# Patient Record
Sex: Female | Born: 1955 | Race: Black or African American | Hispanic: No | Smoking: Never smoker
Health system: Southern US, Community
[De-identification: ages and names within clinical notes are randomized; demographics above are authoritative.]

## PROBLEM LIST (undated history)

## (undated) DIAGNOSIS — Z9289 Personal history of other medical treatment: Secondary | ICD-10-CM

## (undated) DIAGNOSIS — Z9889 Other specified postprocedural states: Secondary | ICD-10-CM

## (undated) DIAGNOSIS — I10 Essential (primary) hypertension: Secondary | ICD-10-CM

## (undated) DIAGNOSIS — E785 Hyperlipidemia, unspecified: Secondary | ICD-10-CM

## (undated) DIAGNOSIS — E559 Vitamin D deficiency, unspecified: Secondary | ICD-10-CM

## (undated) DIAGNOSIS — F32A Depression, unspecified: Secondary | ICD-10-CM

## (undated) DIAGNOSIS — E7800 Pure hypercholesterolemia, unspecified: Secondary | ICD-10-CM

## (undated) DIAGNOSIS — J309 Allergic rhinitis, unspecified: Secondary | ICD-10-CM

## (undated) DIAGNOSIS — E669 Obesity, unspecified: Secondary | ICD-10-CM

## (undated) DIAGNOSIS — F329 Major depressive disorder, single episode, unspecified: Secondary | ICD-10-CM

## (undated) DIAGNOSIS — R9431 Abnormal electrocardiogram [ECG] [EKG]: Secondary | ICD-10-CM

## (undated) HISTORY — DX: Abnormal electrocardiogram (ECG) (EKG): R94.31

## (undated) HISTORY — DX: Obesity, unspecified: E66.9

## (undated) HISTORY — DX: Pure hypercholesterolemia, unspecified: E78.00

## (undated) HISTORY — DX: Personal history of other medical treatment: Z92.89

## (undated) HISTORY — DX: Essential (primary) hypertension: I10

## (undated) HISTORY — DX: Vitamin D deficiency, unspecified: E55.9

## (undated) HISTORY — DX: Other specified postprocedural states: Z98.890

## (undated) HISTORY — DX: Major depressive disorder, single episode, unspecified: F32.9

## (undated) HISTORY — DX: Hyperlipidemia, unspecified: E78.5

## (undated) HISTORY — PX: CHOLECYSTECTOMY: SHX55

## (undated) HISTORY — DX: Allergic rhinitis, unspecified: J30.9

## (undated) HISTORY — DX: Depression, unspecified: F32.A

## (undated) HISTORY — PX: ABDOMINAL HYSTERECTOMY: SHX81

---

## 2003-05-20 ENCOUNTER — Ambulatory Visit (HOSPITAL_COMMUNITY): Admission: RE | Admit: 2003-05-20 | Discharge: 2003-05-20 | Payer: Self-pay | Admitting: Obstetrics & Gynecology

## 2006-04-18 ENCOUNTER — Inpatient Hospital Stay (HOSPITAL_COMMUNITY): Admission: AD | Admit: 2006-04-18 | Discharge: 2006-04-19 | Payer: Self-pay | Admitting: Obstetrics

## 2006-08-08 ENCOUNTER — Encounter (INDEPENDENT_AMBULATORY_CARE_PROVIDER_SITE_OTHER): Payer: Self-pay | Admitting: Specialist

## 2006-08-08 ENCOUNTER — Inpatient Hospital Stay (HOSPITAL_COMMUNITY): Admission: RE | Admit: 2006-08-08 | Discharge: 2006-08-11 | Payer: Self-pay | Admitting: Obstetrics & Gynecology

## 2008-06-28 ENCOUNTER — Encounter: Admission: RE | Admit: 2008-06-28 | Discharge: 2008-06-28 | Payer: Self-pay | Admitting: Internal Medicine

## 2009-08-02 ENCOUNTER — Encounter: Admission: RE | Admit: 2009-08-02 | Discharge: 2009-08-02 | Payer: Self-pay | Admitting: Internal Medicine

## 2009-11-30 ENCOUNTER — Encounter: Admission: RE | Admit: 2009-11-30 | Discharge: 2009-11-30 | Payer: Self-pay | Admitting: Internal Medicine

## 2010-09-21 ENCOUNTER — Other Ambulatory Visit: Payer: Self-pay | Admitting: Internal Medicine

## 2010-09-21 DIAGNOSIS — Z1231 Encounter for screening mammogram for malignant neoplasm of breast: Secondary | ICD-10-CM

## 2010-10-27 ENCOUNTER — Ambulatory Visit
Admission: RE | Admit: 2010-10-27 | Discharge: 2010-10-27 | Disposition: A | Discharge status: Home / Self Care | Payer: 59 | Source: Ambulatory Visit | Attending: Internal Medicine | Admitting: Internal Medicine

## 2010-10-27 DIAGNOSIS — Z1231 Encounter for screening mammogram for malignant neoplasm of breast: Secondary | ICD-10-CM

## 2010-11-17 NOTE — Discharge Summary (Signed)
NAME:  Kathy Juarez, Kathy Juarez NO.:  [id number]   MEDICAL RECORD NO.:  [id number]          PATIENT TYPE:  INP   LOCATION:  9308                          FACILITY:  [facility]   PHYSICIAN:  Roseanna Rainbow, M.D.DATE OF BIRTH:  December 29, 1955   DATE OF ADMISSION:  08/08/2006  DATE OF DISCHARGE:  08/11/2006                               DISCHARGE SUMMARY   CHIEF COMPLAINT:  The patient is a 55 year old African-American female  with a diagnosis of uterine fibroids with secondary menometrorrhagia and  iron deficiency anemia for an abdominal hysterectomy.  Please see the  dictated history and physical for further details.   HOSPITAL COURSE:  The patient was admitted and underwent a total  abdominal hysterectomy.  Again, please see the dictated operative  summary.  On postoperative day #1 her hemoglobin was 6.8.  She had been  transfused intraoperatively 2 units of packed red blood cells.  On  postoperative day #2 her hemoglobin was stable at 6.5 and she was  asymptomatic.  She was discharged to home on postoperative day #3  tolerating a regular diet.   DISCHARGE DIAGNOSES:  1. Uterine fibroids.  2. Iron deficiency anemia.   CONDITION ON DISCHARGE:  Condition stable.   DIET:  Regular.   ACTIVITY:  Pelvic rest, progressive activity.   DISCHARGE MEDICATIONS:  Medications included Percocet, Repliva.   DISPOSITION:  The patient was to follow up in the office in several  days.      Roseanna Rainbow, M.D.  Electronically Signed     LAJ/MEDQ  D:  08/29/2006  T:  08/29/2006  Job:  [id number]

## 2010-11-17 NOTE — Op Note (Signed)
NAME:  Kathy Juarez, Kathy Juarez NO.:  [id number]   MEDICAL RECORD NO.:  [id number]          PATIENT TYPE:  AMB   LOCATION:  SDC                           [facility]  [facility]   PHYSICIAN:  Roseanna Rainbow, M.D.DATE OF BIRTH:  [birth year]   DATE OF PROCEDURE:  08/08/2006  DATE OF DISCHARGE:                               OPERATIVE REPORT   PREOPERATIVE DIAGNOSIS:  Symptomatic uterine fibroids.   POSTOPERATIVE DIAGNOSIS:  Symptomatic uterine fibroids.   PROCEDURE:  Total abdominal hysterectomy.   SURGEON:  Jackson-Moore.   ANESTHESIA:  General endotracheal.   PATHOLOGY:  Uterus and cervix.   ESTIMATED BLOOD LOSS:  200 mL.   COMPLICATIONS:  None.   PROCEDURE:  The patient was taken to the operating room with an IV  running.  She was given general anesthesia, placed in the dorsal  lithotomy position and prepped and draped in the usual sterile fashion.  A midline vertical incision was then made through the previous scar and  carried down to the underlying fascia.  The fascia was incised along the  length of the incision.  The parietal peritoneum was tented up and  entered sharply.  The peritoneal incision was then extended superiorly  and inferiorly.  The bowel was then packed away with moistened  laparotomy packs.  Two long Kelly clamps were placed in the cornu for  retraction and the uterus exteriorized.  The round ligaments were then  divided with the Bovie bilaterally.  The utero-ovarian ligaments and  fallopian tubes were then doubly clamped with parametrial clamps,  transected and both free ligatures and suture ligatures were placed.  Adequate hemostasis was noted.  Again, this was performed bilaterally.  The bladder flap was then developed.  The bladder was then dissected  using the Bovie from the lower uterine segment and cervix.  The uterine  arteries were then clamped bilaterally, transected and suture ligated.  At this point, the uterine fundus was amputated  above the level of the  uterine pedicles.  The Balfour retractor was then placed into the  incisions.  The cardinal ligaments were then clamped, transected and  suture ligated bilaterally.  The uterosacral ligaments were then  clamped, transected and suture ligated bilaterally as well.  The cervix  was then amputated with scissors.  The remainder of the vaginal cuff was  then secured with interrupted sutures of 0 Vicryl.  The pelvis was then  copiously irrigated.  The pedicles were visualized and felt to be  hemostatic.  The parietal peritoneum and fascia were reapproximated in a  running fashion  as a single layer closure using a 0 PDS.  The skin was reapproximated in  a subcuticular fashion using 3-0 Monocryl.  Dermabond was then applied  to the skin.  At the close of the procedure, the instrument and pack  counts were said to be correct x2.  The patient was taken to the PACU  awake and in stable condition.      Roseanna Rainbow, M.D.  Electronically Signed     LAJ/MEDQ  D:  08/08/2006  T:  08/08/2006  Job:  [id number]

## 2010-11-17 NOTE — H&P (Signed)
NAME:  Kathy Juarez, Kathy Juarez NO.:  [id number]   MEDICAL RECORD NO.:  [id number]           PATIENT TYPE:   LOCATION:                                [facility]:  [facility]   PHYSICIAN:  Roseanna Rainbow, M.D. DATE OF BIRTH:   DATE OF ADMISSION:  DATE OF DISCHARGE:                              HISTORY & PHYSICAL   CHIEF COMPLAINT:  The patient is a 55 year old African-American female  with a diagnosis of uterine fibroids with secondary menometrorrhagia  iron and deficiency anemia refractory to attempts to control medically.   HISTORY OF PRESENT ILLNESS:  In November of 2004 she underwent an  ultrasound that demonstrated a uterus that was 11.9 cm in saggital  length and there were multiple fibroids noted measuring between 3 and 7  cm in diameter.  For approximately the past year the patient describes  heavier bleeding with menses, passing blood clots, the menses lasting  six days with associated cramping.  Her senses have been refractory to  attempts to manage these complaints with a mini pill and subsequent Depo-  Provera.  A recent hemoglobin on January 25 was 8.3 and a Pap smear from  October of 2006 was negative.   PAST GYN HISTORY:  Please see the above.  1. She has a history of cervical polyps.  2. She also has a history of a postpartum bilateral tubal ligation and      D&C.   PAST MEDICAL HISTORY:  No significant history of medical diseases.   PAST SURGICAL HISTORY:  She has a history of a cholecystectomy.  Please  see the above.   PAST OBSTETRICAL HISTORY:  The patient has been pregnant four times.  She has three living children.  There is a history of one miscarriage or  abortion.  She has had three cesarean deliveries.   SOCIAL HISTORY:  She is divorced.  She is an income maintenance case  Financial controller.  She has no significant smoking history.  She does not give any  significant history of alcohol usage.  She reports a minimal amount of  caffeinated beverages  daily.   FAMILY HISTORY:  CVA, hypertension, myocardial infarction.   MEDICATIONS:  Please see the medication reconciliation form.   ALLERGIES:  No known drug allergy.   PHYSICAL EXAMINATION:  VITAL SIGNS:  Temperature 97.9, pulse 101, blood  pressure 140/80, height 5 feet 3 inches, weight 221 pounds.  GENERAL:  Well-developed, well-nourished, no apparent distress.  NECK:  Supple.  LUNGS:  Clear to auscultation bilaterally.  HEART:  Regular rate and rhythm.  ABDOMEN:  Nontender.  No organomegaly.  PELVIC:  Normal EGBUS.  On speculum exam the vagina is clean.  On  bimanual exam the uterus is approximately 12-14 weeks' size, irregular  contour.  Nontender.  The adnexa are nonpalpable, nontender.   ASSESSMENT:  Symptomatic uterine fibroids with secondary menorrhagia and  iron deficiency anemia refractory to attempts to manage medically.   PLAN:  The planned procedure is a total abdominal hysterectomy.  The  risks and benefits of surgery were reviewed with the patient at length  and informed consent had  been obtained.      Roseanna Rainbow, M.D.  Electronically Signed     LAJ/MEDQ  D:  08/07/2006  T:  08/07/2006  Job:  [id number]

## 2011-10-02 ENCOUNTER — Other Ambulatory Visit: Payer: Self-pay | Admitting: Internal Medicine

## 2011-10-02 DIAGNOSIS — Z1231 Encounter for screening mammogram for malignant neoplasm of breast: Secondary | ICD-10-CM

## 2011-11-28 ENCOUNTER — Ambulatory Visit
Admission: RE | Admit: 2011-11-28 | Discharge: 2011-11-28 | Disposition: A | Discharge status: Home / Self Care | Payer: 59 | Source: Ambulatory Visit | Attending: Internal Medicine | Admitting: Internal Medicine

## 2011-11-28 DIAGNOSIS — Z1231 Encounter for screening mammogram for malignant neoplasm of breast: Secondary | ICD-10-CM

## 2012-12-11 ENCOUNTER — Other Ambulatory Visit: Payer: Self-pay

## 2012-12-11 DIAGNOSIS — Z1231 Encounter for screening mammogram for malignant neoplasm of breast: Secondary | ICD-10-CM

## 2013-01-12 ENCOUNTER — Ambulatory Visit
Admission: RE | Admit: 2013-01-12 | Discharge: 2013-01-12 | Disposition: A | Discharge status: Home / Self Care | Payer: 59 | Source: Ambulatory Visit

## 2013-01-12 DIAGNOSIS — Z1231 Encounter for screening mammogram for malignant neoplasm of breast: Secondary | ICD-10-CM

## 2014-02-09 ENCOUNTER — Other Ambulatory Visit: Payer: Self-pay

## 2014-02-09 DIAGNOSIS — Z1231 Encounter for screening mammogram for malignant neoplasm of breast: Secondary | ICD-10-CM

## 2014-02-12 ENCOUNTER — Ambulatory Visit
Admission: RE | Admit: 2014-02-12 | Discharge: 2014-02-12 | Disposition: A | Discharge status: Home / Self Care | Payer: 59 | Source: Ambulatory Visit

## 2014-02-12 DIAGNOSIS — Z1231 Encounter for screening mammogram for malignant neoplasm of breast: Secondary | ICD-10-CM

## 2014-02-24 ENCOUNTER — Ambulatory Visit: Payer: Self-pay

## 2014-02-24 ENCOUNTER — Other Ambulatory Visit: Payer: Self-pay | Admitting: Occupational Medicine

## 2014-02-24 DIAGNOSIS — R7611 Nonspecific reaction to tuberculin skin test without active tuberculosis: Secondary | ICD-10-CM

## 2014-07-03 ENCOUNTER — Emergency Department
Admission: EM | Admit: 2014-07-03 | Discharge: 2014-07-03 | Disposition: A | Discharge status: Home / Self Care | Payer: 59 | Source: Home / Self Care | Attending: Family Medicine | Admitting: Family Medicine

## 2014-07-03 ENCOUNTER — Encounter: Payer: Self-pay | Admitting: Emergency Medicine

## 2014-07-03 DIAGNOSIS — J209 Acute bronchitis, unspecified: Secondary | ICD-10-CM

## 2014-07-03 HISTORY — DX: Essential (primary) hypertension: I10

## 2014-07-03 MED ORDER — AZITHROMYCIN 250 MG PO TABS: ORAL_TABLET | ORAL | Status: DC

## 2014-07-03 MED ORDER — BENZONATATE 200 MG PO CAPS: 200.0000 mg | ORAL_CAPSULE | Freq: Every day | ORAL | Status: DC

## 2014-07-03 NOTE — ED Notes (Signed)
Reports 2 week history of congestion and cough with intermittent/temporary improvements. Took sudafed at 0800 today.

## 2014-07-03 NOTE — Discharge Instructions (Signed)
Take plain Mucinex (1200 mg guaifenesin) twice daily for cough and congestion.  Increase fluid intake, rest. May use Afrin nasal spray (or generic oxymetazoline) twice daily for about 5 days.  Also recommend using saline nasal spray several times daily and saline nasal irrigation (AYR is a common brand) Try warm salt water gargles for sore throat.  Stop all antihistamines for now, and other non-prescription cough/cold preparations.  Follow-up with family doctor if not improving about one week

## 2014-07-03 NOTE — ED Provider Notes (Signed)
CSN: [id number]     Arrival date & time 07/03/14  1115 History   First MD Initiated Contact with Patient 07/03/14 1356     Chief Complaint  Patient presents with  . Cough  . Nasal Congestion      HPI Comments: About two weeks ago patient developed sneezing and nasal congestion but no sore throat.  One week ago she developed a non-productive cough that has persisted and is worse at night.  She often coughs until she gags.  She has had a headache over the past two days.  The history is provided by the patient.    Past Medical History  Diagnosis Date  . Hypertension    Past Surgical History  Procedure Laterality Date  . Cholecystectomy    . Abdominal hysterectomy    . Cesarean section      x 3   Family History  Problem Relation Age of Onset  . Hypertension Mother    History  Substance Use Topics  . Smoking status: Never Smoker   . Smokeless tobacco: Not on file  . Alcohol Use: No   OB History    No data available     Review of Systems No sore throat + sneezing + cough No pleuritic pain No wheezing + nasal congestion + post-nasal drainage No sinus pain/pressure No itchy/red eyes No earache No hemoptysis No SOB No fever/chills No nausea No vomiting No abdominal pain No diarrhea No urinary symptoms No skin rash No fatigue No myalgias + headache Used OTC meds without relief  Allergies  Review of patient's allergies indicates no known allergies.  Home Medications   Prior to Admission medications   Medication Sig Start Date End Date Taking? Authorizing Provider  aspirin 81 MG chewable tablet Chew by mouth daily.   Yes Historical Provider, MD  losartan (COZAAR) 25 MG tablet Take 25 mg by mouth daily.   Yes Historical Provider, MD  azithromycin (ZITHROMAX Z-PAK) 250 MG tablet Take 2 tabs today; then begin one tab once daily for 4 more days. 07/03/14   Lattie Haw, MD  benzonatate (TESSALON) 200 MG capsule Take 1 capsule (200 mg total) by mouth at bedtime.  Take as needed for cough 07/03/14   Lattie Haw, MD   BP 137/83 mmHg  Pulse 100  Temp(Src) 98.4 F (36.9 C) (Oral)  Resp 16  Ht  (1.6 m)  Wt 250 lb (113.399 kg)  BMI 44.30 kg/m2  SpO2 96% Physical Exam Nursing notes and Vital Signs reviewed. Appearance:  Patient appears stated age, and in no acute distress.  Patient is obese (BMI 44.3) Eyes:  Pupils are equal, round, and reactive to light and accomodation.  Extraocular movement is intact.  Conjunctivae are not inflamed  Ears:  Canals normal.  Tympanic membranes normal.  Nose:  Mildly congested turbinates.  No sinus tenderness.  Pharynx:  Normal Neck:  Supple.   Tender enlarged posterior nodes are palpated bilaterally  Lungs:  Clear to auscultation.  Breath sounds are equal.  Chest:  Distinct tenderness to palpation over the mid-sternum.  Heart:  Regular rate and rhythm without murmurs, rubs, or gallops.  Abdomen:  Nontender without masses or hepatosplenomegaly.  Bowel sounds are present.  No CVA or flank tenderness.  Extremities:  No edema.  No calf tenderness Skin:  No rash present.   ED Course  Procedures  none     MDM   1. Acute bronchitis, unspecified organism    Begin Z-pack to cover atypicals.  Prescription written for Benzonatate [facility]) to take at bedtime for night-time cough.  Take plain Mucinex (1200 mg guaifenesin) twice daily for cough and congestion.  Increase fluid intake, rest. May use Afrin nasal spray (or generic oxymetazoline) twice daily for about 5 days.  Also recommend using saline nasal spray several times daily and saline nasal irrigation (AYR is a common brand) Try warm salt water gargles for sore throat.  Stop all antihistamines for now, and other non-prescription cough/cold preparations.  Follow-up with family doctor if not improving about one week    Lattie Haw, MD 07/09/14 [phone]

## 2015-06-24 ENCOUNTER — Other Ambulatory Visit: Payer: Self-pay

## 2015-06-24 DIAGNOSIS — Z1231 Encounter for screening mammogram for malignant neoplasm of breast: Secondary | ICD-10-CM

## 2015-07-18 ENCOUNTER — Ambulatory Visit: Payer: Self-pay

## 2015-07-28 ENCOUNTER — Ambulatory Visit
Admission: RE | Admit: 2015-07-28 | Discharge: 2015-07-28 | Disposition: A | Discharge status: Home / Self Care | Payer: Managed Care, Other (non HMO) | Source: Ambulatory Visit

## 2015-07-28 DIAGNOSIS — Z1231 Encounter for screening mammogram for malignant neoplasm of breast: Secondary | ICD-10-CM

## 2015-10-13 DIAGNOSIS — I10 Essential (primary) hypertension: Secondary | ICD-10-CM | POA: Diagnosis not present

## 2015-10-13 DIAGNOSIS — Z79899 Other long term (current) drug therapy: Secondary | ICD-10-CM | POA: Diagnosis not present

## 2015-10-13 DIAGNOSIS — E785 Hyperlipidemia, unspecified: Secondary | ICD-10-CM | POA: Diagnosis not present

## 2015-10-13 DIAGNOSIS — R7309 Other abnormal glucose: Secondary | ICD-10-CM | POA: Diagnosis not present

## 2015-10-13 DIAGNOSIS — E559 Vitamin D deficiency, unspecified: Secondary | ICD-10-CM | POA: Diagnosis not present

## 2015-12-05 DIAGNOSIS — N951 Menopausal and female climacteric states: Secondary | ICD-10-CM | POA: Diagnosis not present

## 2015-12-05 DIAGNOSIS — R635 Abnormal weight gain: Secondary | ICD-10-CM | POA: Diagnosis not present

## 2015-12-08 DIAGNOSIS — E7800 Pure hypercholesterolemia, unspecified: Secondary | ICD-10-CM | POA: Diagnosis not present

## 2015-12-08 DIAGNOSIS — E538 Deficiency of other specified B group vitamins: Secondary | ICD-10-CM | POA: Diagnosis not present

## 2015-12-08 DIAGNOSIS — R7301 Impaired fasting glucose: Secondary | ICD-10-CM | POA: Diagnosis not present

## 2015-12-12 DIAGNOSIS — E559 Vitamin D deficiency, unspecified: Secondary | ICD-10-CM | POA: Diagnosis not present

## 2015-12-12 DIAGNOSIS — E7800 Pure hypercholesterolemia, unspecified: Secondary | ICD-10-CM | POA: Diagnosis not present

## 2015-12-12 DIAGNOSIS — R7301 Impaired fasting glucose: Secondary | ICD-10-CM | POA: Diagnosis not present

## 2016-02-06 DIAGNOSIS — E538 Deficiency of other specified B group vitamins: Secondary | ICD-10-CM | POA: Diagnosis not present

## 2016-02-06 DIAGNOSIS — R7301 Impaired fasting glucose: Secondary | ICD-10-CM | POA: Diagnosis not present

## 2016-02-06 DIAGNOSIS — E7800 Pure hypercholesterolemia, unspecified: Secondary | ICD-10-CM | POA: Diagnosis not present

## 2016-03-27 DIAGNOSIS — Z23 Encounter for immunization: Secondary | ICD-10-CM | POA: Diagnosis not present

## 2016-05-02 DIAGNOSIS — I10 Essential (primary) hypertension: Secondary | ICD-10-CM | POA: Diagnosis not present

## 2016-05-02 DIAGNOSIS — Z0000 Encounter for general adult medical examination without abnormal findings: Secondary | ICD-10-CM | POA: Diagnosis not present

## 2017-03-05 ENCOUNTER — Other Ambulatory Visit: Payer: Self-pay | Admitting: Internal Medicine

## 2017-03-05 DIAGNOSIS — Z1231 Encounter for screening mammogram for malignant neoplasm of breast: Secondary | ICD-10-CM

## 2017-03-06 ENCOUNTER — Ambulatory Visit
Admission: RE | Admit: 2017-03-06 | Discharge: 2017-03-06 | Disposition: A | Discharge status: Home / Self Care | Payer: BLUE CROSS/BLUE SHIELD | Source: Ambulatory Visit | Attending: Internal Medicine | Admitting: Internal Medicine

## 2017-03-06 DIAGNOSIS — Z1231 Encounter for screening mammogram for malignant neoplasm of breast: Secondary | ICD-10-CM

## 2017-09-11 DIAGNOSIS — Z7982 Long term (current) use of aspirin: Secondary | ICD-10-CM | POA: Diagnosis not present

## 2017-09-11 DIAGNOSIS — Z0000 Encounter for general adult medical examination without abnormal findings: Secondary | ICD-10-CM | POA: Diagnosis not present

## 2017-09-11 DIAGNOSIS — I10 Essential (primary) hypertension: Secondary | ICD-10-CM | POA: Diagnosis not present

## 2017-09-11 DIAGNOSIS — M25552 Pain in left hip: Secondary | ICD-10-CM | POA: Diagnosis not present

## 2017-10-28 DIAGNOSIS — Z1211 Encounter for screening for malignant neoplasm of colon: Secondary | ICD-10-CM | POA: Diagnosis not present

## 2017-10-28 DIAGNOSIS — I10 Essential (primary) hypertension: Secondary | ICD-10-CM | POA: Diagnosis not present

## 2017-10-28 DIAGNOSIS — Z6841 Body Mass Index (BMI) 40.0 and over, adult: Secondary | ICD-10-CM | POA: Diagnosis not present

## 2017-10-28 DIAGNOSIS — Z79899 Other long term (current) drug therapy: Secondary | ICD-10-CM | POA: Diagnosis not present

## 2017-12-12 DIAGNOSIS — I10 Essential (primary) hypertension: Secondary | ICD-10-CM | POA: Insufficient documentation

## 2017-12-12 DIAGNOSIS — R9431 Abnormal electrocardiogram [ECG] [EKG]: Secondary | ICD-10-CM | POA: Insufficient documentation

## 2017-12-12 DIAGNOSIS — E785 Hyperlipidemia, unspecified: Secondary | ICD-10-CM | POA: Insufficient documentation

## 2017-12-16 ENCOUNTER — Encounter: Payer: Self-pay | Admitting: Cardiovascular Disease

## 2017-12-16 ENCOUNTER — Ambulatory Visit: Payer: BLUE CROSS/BLUE SHIELD | Admitting: Cardiovascular Disease

## 2017-12-16 VITALS — BP 131/88 | HR 99 | Ht 63.0 in | Wt 270.0 lb

## 2017-12-16 DIAGNOSIS — I10 Essential (primary) hypertension: Secondary | ICD-10-CM

## 2017-12-16 DIAGNOSIS — R9431 Abnormal electrocardiogram [ECG] [EKG]: Secondary | ICD-10-CM

## 2017-12-16 DIAGNOSIS — R0602 Shortness of breath: Secondary | ICD-10-CM

## 2017-12-16 NOTE — Progress Notes (Signed)
Cardiology Office Note   Date:  12/16/2017   ID:  Reather LaurenceGeriloel D Dimmer, DOB 01/15/1956, MRN [id number]  PCP:  Dorothyann PengSanders, Robyn, MD  Cardiologist:   Chilton Siiffany Anna, MD   Chief Complaint  Patient presents with  . New Patient (Initial Visit)    Pt states no Sx. Has not taking BP Medication in 3 days.      History of Present Illness: Kathy Juarez is a 62 y.o. female with hypertension who is being seen today for the evaluation of abnormal EKG at the request of Dorothyann PengSanders, Robyn, MD.  Kathy Juarez saw Dr. Allyne GeeSanders on 09/23/17 and was doing well.  Her BP was above goal and she was encouraged to limit salt intake.  She had an EKG that was reportedly concerning for prior inferior MI.  She was referred to cardiology for further evaluation.  Overall Kathy Juarez has been feeling well.  She has no chest pain.  She does have some shortness of breath with exertion.  She does not get much exercise.  She wants to start an exercise program.  She works in [place] and has to drive back and forth each day, which limits her time for exercise.  By the time she gets home she is too tired to exercise.  She has not experienced any lower extremity edema, orthopnea, or PND.  She recently started to limit processed sugars from her diet.  She no longer drinks sodas.  She  has been told her heart is irregular in the past.  She has no palpitations, lightheadedness, or dizziness.  She does not drink caffeine and rarely has alcohol.  Past Medical History:  Diagnosis Date  . Abnormal EKG   . Allergic rhinitis   . Benign essential hypertension   . Depressive disorder   . Hyperlipidemia   . Obesity   . Vitamin D deficiency     Past Surgical History:  Procedure Laterality Date  . ABDOMINAL HYSTERECTOMY    . CESAREAN SECTION     x 3  . CHOLECYSTECTOMY       Current Outpatient Medications  Medication Sig Dispense Refill  . aspirin 81 MG chewable tablet Chew by mouth daily.    . pravastatin (PRAVACHOL) 40 MG tablet  Take 40 mg by mouth daily.  2  . telmisartan-hydrochlorothiazide (MICARDIS HCT) 80-12.5 MG tablet     . Vitamin D, Ergocalciferol, (DRISDOL) 50000 units CAPS capsule      No current facility-administered medications for this visit.     Allergies:   Latex    Social History:  The patient  reports that she has never smoked. She has never used smokeless tobacco. She reports that she does not drink alcohol or use drugs.   Family History:  The patient's family history includes Alcohol abuse in her father; Heart attack in her maternal grandfather; Hypertension in her maternal grandmother and mother; Myasthenia gravis in her sister; Stroke in her maternal grandmother; Ulcers in her maternal grandmother.    ROS:  Please see the history of present illness.   Otherwise, review of systems are positive for none.   All other systems are reviewed and negative.    PHYSICAL EXAM: VS:  BP 131/88   Pulse 99   Ht 5\' 3"  (1.6 m)   Wt 270 lb (122.5 kg)   BMI 47.83 kg/m  , BMI Body mass index is 47.83 kg/m. GENERAL:  Well appearing HEENT:  Pupils equal round and reactive, fundi not visualized, oral mucosa unremarkable NECK:  No jugular venous distention, waveform within normal limits, carotid upstroke brisk and symmetric, no bruits, no thyromegaly LYMPHATICS:  No cervical adenopathy LUNGS:  Clear to auscultation bilaterally HEART: Irregularly irregular.  PMI not displaced or sustained,S1 and S2 within normal limits, no S3, no S4, no clicks, no rubs, no murmurs ABD:  Flat, positive bowel sounds normal in frequency in pitch, no bruits, no rebound, no guarding, no midline pulsatile mass, no hepatomegaly, no splenomegaly EXT:  2 plus pulses throughout, no edema, no cyanosis no clubbing SKIN:  No rashes no nodules NEURO:  Cranial nerves II through XII grossly intact, motor grossly intact throughout PSYCH:  Cognitively intact, oriented to person place and time    EKG:  EKG is ordered today. The ekg  ordered today demonstrates sinus arrhythmia.  Rate 99 bpm.  Cannot rule out prior inferior infarct.    Recent Labs: No results found for requested labs within last 8760 hours.   09/11/2017: Total cholesterol 178, triglycerides 105, HDL 49, LDL 108 WBC 5.5, hemoglobin 14, hematocrit 42.2, platelets 243 Sodium 141, potassium 4.1, BUN 11, creatinine 0.82   Lipid Panel No results found for: CHOL, TRIG, HDL, CHOLHDL, VLDL, LDLCALC, LDLDIRECT    Wt Readings from Last 3 Encounters:  12/16/17 270 lb (122.5 kg)  07/03/14 250 lb (113.4 kg)      ASSESSMENT AND PLAN:  # Abnormal EKG: Concerning for prior infarct.  She does not recall having an event.  She isn't very physically active but wants to start.  She has exertional dyspnea.  We will get an exercise Myoview to assess for prior inferior infarct or ischemia.  # Hypertension: BP above goal but she hasn't taken any meds in 2 days. They are on their way through mail order.   # Morbid obesity: Discussed the importance of increasing exercise and continuing to make healthy dietary changes.   Current medicines are reviewed at length with the patient today.  The patient does not have concerns regarding medicines.  The following changes have been made:  no change  Labs/ tests ordered today include:   Orders Placed This Encounter  Procedures  . MYOCARDIAL PERFUSION IMAGING  . EKG 12-Lead     Disposition:   FU with Kathy Winterrowd C. Duke Salvia, MD, [facility] as needed.      Signed, Sharlett Lienemann C. Duke Salvia, MD, [facility]  12/16/2017 9:29 AM    [facility]

## 2017-12-16 NOTE — Patient Instructions (Addendum)
Medication Instructions:  Your physician recommends that you continue on your current medications as directed. Please refer to the Current Medication list given to you today.  Labwork: NONE  Testing/Procedures: Your physician has requested that you have en exercise stress myoview. For further information please visit [email or web address] Please follow instruction sheet, as given.  Follow-Up: AS NEEDED   Cardiac Nuclear Scan A cardiac nuclear scan is a test that measures blood flow to the heart when a person is resting and when he or she is exercising. The test looks for problems such as:  Not enough blood reaching a portion of the heart.  The heart muscle not working normally.  You may need this test if:  You have heart disease.  You have had abnormal lab results.  You have had heart surgery or angioplasty.  You have chest pain.  You have shortness of breath.  In this test, a radioactive dye (tracer) is injected into your bloodstream. After the tracer has traveled to your heart, an imaging device is used to measure how much of the tracer is absorbed by or distributed to various areas of your heart. This procedure is usually done at a hospital and takes 2-4 hours. Tell a health care provider about:  Any allergies you have.  All medicines you are taking, including vitamins, herbs, eye drops, creams, and over-the-counter medicines.  Any problems you or family members have had with the use of anesthetic medicines.  Any blood disorders you have.  Any surgeries you have had.  Any medical conditions you have.  Whether you are pregnant or may be pregnant. What are the risks? Generally, this is a safe procedure. However, problems may occur, including:  Serious chest pain and heart attack. This is only a risk if the stress portion of the test is done.  Rapid heartbeat.  Sensation of warmth in your chest. This usually passes quickly.  What happens before the  procedure?  Ask your health care provider about changing or stopping your regular medicines. This is especially important if you are taking diabetes medicines or blood thinners.  Remove your jewelry on the day of the procedure. What happens during the procedure?  An IV tube will be inserted into one of your veins.  Your health care provider will inject a small amount of radioactive tracer through the tube.  You will wait for 20-40 minutes while the tracer travels through your bloodstream.  Your heart activity will be monitored with an electrocardiogram (ECG).  You will lie down on an exam table.  Images of your heart will be taken for about 15-20 minutes.  You may be asked to exercise on a treadmill or stationary bike. While you exercise, your heart's activity will be monitored with an ECG, and your blood pressure will be checked. If you are unable to exercise, you may be given a medicine to increase blood flow to parts of your heart.  When blood flow to your heart has peaked, a tracer will again be injected through the IV tube.  After 20-40 minutes, you will get back on the exam table and have more images taken of your heart.  When the procedure is over, your IV tube will be removed. The procedure may vary among health care providers and hospitals. Depending on the type of tracer used, scans may need to be repeated 3-4 hours later. What happens after the procedure?  Unless your health care provider tells you otherwise, you may return to your normal schedule, including diet,  activities, and medicines.  Unless your health care provider tells you otherwise, you may increase your fluid intake. This will help flush the contrast dye from your body. Drink enough fluid to keep your urine clear or pale yellow.  It is up to you to get your test results. Ask your health care provider, or the department that is doing the test, when your results will be ready. Summary  A cardiac nuclear scan  measures the blood flow to the heart when a person is resting and when he or she is exercising.  You may need this test if you are at risk for heart disease.  Tell your health care provider if you are pregnant.  Unless your health care provider tells you otherwise, increase your fluid intake. This will help flush the contrast dye from your body. Drink enough fluid to keep your urine clear or pale yellow. This information is not intended to replace advice given to you by your health care provider. Make sure you discuss any questions you have with your health care provider. Document Released: 07/13/2004 Document Revised: 06/20/2016 Document Reviewed: 05/27/2013 Elsevier Interactive Patient Education  2017 [employer]

## 2017-12-17 ENCOUNTER — Ambulatory Visit (HOSPITAL_COMMUNITY)
Admission: RE | Admit: 2017-12-17 | Discharge: 2017-12-17 | Disposition: A | Discharge status: Home / Self Care | Payer: BLUE CROSS/BLUE SHIELD | Source: Ambulatory Visit | Attending: Cardiovascular Disease | Admitting: Cardiovascular Disease

## 2017-12-17 ENCOUNTER — Encounter (HOSPITAL_COMMUNITY): Payer: BLUE CROSS/BLUE SHIELD

## 2017-12-17 DIAGNOSIS — R9439 Abnormal result of other cardiovascular function study: Secondary | ICD-10-CM | POA: Insufficient documentation

## 2017-12-17 DIAGNOSIS — R9431 Abnormal electrocardiogram [ECG] [EKG]: Secondary | ICD-10-CM

## 2017-12-17 DIAGNOSIS — Z6841 Body Mass Index (BMI) 40.0 and over, adult: Secondary | ICD-10-CM | POA: Insufficient documentation

## 2017-12-17 DIAGNOSIS — I2510 Atherosclerotic heart disease of native coronary artery without angina pectoris: Secondary | ICD-10-CM | POA: Diagnosis not present

## 2017-12-17 DIAGNOSIS — Z8249 Family history of ischemic heart disease and other diseases of the circulatory system: Secondary | ICD-10-CM | POA: Diagnosis not present

## 2017-12-17 DIAGNOSIS — I10 Essential (primary) hypertension: Secondary | ICD-10-CM | POA: Insufficient documentation

## 2017-12-17 DIAGNOSIS — R0609 Other forms of dyspnea: Secondary | ICD-10-CM | POA: Insufficient documentation

## 2017-12-17 DIAGNOSIS — R0602 Shortness of breath: Secondary | ICD-10-CM | POA: Diagnosis not present

## 2017-12-17 DIAGNOSIS — E669 Obesity, unspecified: Secondary | ICD-10-CM | POA: Insufficient documentation

## 2017-12-17 MED ORDER — TECHNETIUM TC 99M TETROFOSMIN IV KIT
30.1000 | PACK | Freq: Once | INTRAVENOUS | Status: AC | PRN
  Administered 2017-12-17: 30.1 via INTRAVENOUS
  Filled 2017-12-17: qty 31

## 2017-12-18 ENCOUNTER — Ambulatory Visit (HOSPITAL_COMMUNITY)
Admission: RE | Admit: 2017-12-18 | Discharge: 2017-12-18 | Disposition: A | Discharge status: Home / Self Care | Payer: BLUE CROSS/BLUE SHIELD | Source: Ambulatory Visit | Attending: Cardiovascular Disease | Admitting: Cardiovascular Disease

## 2017-12-18 LAB — MYOCARDIAL PERFUSION IMAGING
Estimated workload: 5.6 METS
Exercise duration (min): 5 min
Exercise duration (sec): 0 s
LV dias vol: 53 mL (ref 46–106)
LV sys vol: 16 mL
MPHR: 159 {beats}/min
Peak HR: 155 {beats}/min
Percent HR: 97 %
RPE: 19
Rest HR: 83 {beats}/min
SDS: 4
SRS: 4
SSS: 8
TID: 0.57

## 2017-12-18 MED ORDER — TECHNETIUM TC 99M TETROFOSMIN IV KIT
30.6000 | PACK | Freq: Once | INTRAVENOUS | Status: AC | PRN
  Administered 2017-12-18: 30.6 via INTRAVENOUS

## 2017-12-20 ENCOUNTER — Telehealth: Payer: Self-pay | Admitting: Cardiovascular Disease

## 2017-12-20 NOTE — Telephone Encounter (Signed)
NEW MESSAGE   Patient calling for stress test results. Okay to leave detailed message

## 2017-12-20 NOTE — Telephone Encounter (Signed)
Spoke to patient. Result given . Verbalized understanding  NO FOLLOW  UP IS NEEDED.

## 2018-02-24 DIAGNOSIS — I10 Essential (primary) hypertension: Secondary | ICD-10-CM | POA: Diagnosis not present

## 2018-02-24 DIAGNOSIS — R7309 Other abnormal glucose: Secondary | ICD-10-CM | POA: Diagnosis not present

## 2018-02-24 DIAGNOSIS — R7611 Nonspecific reaction to tuberculin skin test without active tuberculosis: Secondary | ICD-10-CM | POA: Diagnosis not present

## 2018-02-24 DIAGNOSIS — Z79899 Other long term (current) drug therapy: Secondary | ICD-10-CM | POA: Diagnosis not present

## 2018-06-23 ENCOUNTER — Other Ambulatory Visit: Payer: Self-pay | Admitting: Internal Medicine

## 2018-06-23 DIAGNOSIS — Z1231 Encounter for screening mammogram for malignant neoplasm of breast: Secondary | ICD-10-CM

## 2018-06-29 DIAGNOSIS — J209 Acute bronchitis, unspecified: Secondary | ICD-10-CM | POA: Diagnosis not present

## 2018-08-04 ENCOUNTER — Ambulatory Visit
Admission: RE | Admit: 2018-08-04 | Discharge: 2018-08-04 | Disposition: A | Discharge status: Home / Self Care | Payer: BLUE CROSS/BLUE SHIELD | Source: Ambulatory Visit | Attending: Internal Medicine | Admitting: Internal Medicine

## 2018-08-04 DIAGNOSIS — Z1231 Encounter for screening mammogram for malignant neoplasm of breast: Secondary | ICD-10-CM

## 2018-08-14 DIAGNOSIS — E559 Vitamin D deficiency, unspecified: Secondary | ICD-10-CM | POA: Diagnosis not present

## 2018-08-14 DIAGNOSIS — R7301 Impaired fasting glucose: Secondary | ICD-10-CM | POA: Diagnosis not present

## 2018-08-14 DIAGNOSIS — R635 Abnormal weight gain: Secondary | ICD-10-CM | POA: Diagnosis not present

## 2018-08-14 DIAGNOSIS — N951 Menopausal and female climacteric states: Secondary | ICD-10-CM | POA: Diagnosis not present

## 2018-08-14 DIAGNOSIS — E7800 Pure hypercholesterolemia, unspecified: Secondary | ICD-10-CM | POA: Diagnosis not present

## 2018-08-18 DIAGNOSIS — Z1339 Encounter for screening examination for other mental health and behavioral disorders: Secondary | ICD-10-CM | POA: Diagnosis not present

## 2018-08-18 DIAGNOSIS — K219 Gastro-esophageal reflux disease without esophagitis: Secondary | ICD-10-CM | POA: Diagnosis not present

## 2018-08-18 DIAGNOSIS — R7303 Prediabetes: Secondary | ICD-10-CM | POA: Diagnosis not present

## 2018-08-18 DIAGNOSIS — E7800 Pure hypercholesterolemia, unspecified: Secondary | ICD-10-CM | POA: Diagnosis not present

## 2018-08-18 DIAGNOSIS — I10 Essential (primary) hypertension: Secondary | ICD-10-CM | POA: Diagnosis not present

## 2018-08-18 DIAGNOSIS — Z1331 Encounter for screening for depression: Secondary | ICD-10-CM | POA: Diagnosis not present

## 2018-08-18 DIAGNOSIS — N951 Menopausal and female climacteric states: Secondary | ICD-10-CM | POA: Diagnosis not present

## 2018-08-27 ENCOUNTER — Other Ambulatory Visit: Payer: Self-pay | Admitting: Internal Medicine

## 2018-10-13 ENCOUNTER — Other Ambulatory Visit: Payer: Self-pay

## 2018-10-13 ENCOUNTER — Encounter: Payer: Self-pay | Admitting: Internal Medicine

## 2018-10-13 MED ORDER — PRAVASTATIN SODIUM 40 MG PO TABS: 40.0000 mg | ORAL_TABLET | Freq: Every day | ORAL | 0 refills | Status: DC

## 2018-12-29 ENCOUNTER — Ambulatory Visit (INDEPENDENT_AMBULATORY_CARE_PROVIDER_SITE_OTHER): Payer: BLUE CROSS/BLUE SHIELD | Admitting: Internal Medicine

## 2018-12-29 ENCOUNTER — Encounter: Payer: Self-pay | Admitting: Internal Medicine

## 2018-12-29 ENCOUNTER — Other Ambulatory Visit: Payer: Self-pay

## 2018-12-29 VITALS — Ht 63.0 in

## 2018-12-29 DIAGNOSIS — I10 Essential (primary) hypertension: Secondary | ICD-10-CM

## 2018-12-29 DIAGNOSIS — E7800 Pure hypercholesterolemia, unspecified: Secondary | ICD-10-CM | POA: Diagnosis not present

## 2018-12-29 DIAGNOSIS — R7309 Other abnormal glucose: Secondary | ICD-10-CM | POA: Diagnosis not present

## 2018-12-29 MED ORDER — TELMISARTAN-HCTZ 80-12.5 MG PO TABS: 1.0000 | ORAL_TABLET | Freq: Every day | ORAL | 1 refills | Status: DC

## 2018-12-29 MED ORDER — PRAVASTATIN SODIUM 40 MG PO TABS: 40.0000 mg | ORAL_TABLET | Freq: Every day | ORAL | 1 refills | Status: DC

## 2018-12-29 NOTE — Patient Instructions (Signed)

## 2018-12-29 NOTE — Progress Notes (Signed)
Virtual Visit via Video   This visit type was conducted due to national recommendations for restrictions regarding the COVID-19 Pandemic (e.g. social distancing) in an effort to limit this patient's exposure and mitigate transmission in our community.  Due to her co-morbid illnesses, this patient is at least at moderate risk for complications without adequate follow up.  This format is felt to be most appropriate for this patient at this time.  All issues noted in this document were discussed and addressed.  A limited physical exam was performed with this format.    This visit type was conducted due to national recommendations for restrictions regarding the COVID-19 Pandemic (e.g. social distancing) in an effort to limit this patient's exposure and mitigate transmission in our community.  Patients identity confirmed using two different identifiers.  This format is felt to be most appropriate for this patient at this time.  All issues noted in this document were discussed and addressed.  No physical exam was performed (except for noted visual exam findings with Video Visits).    Date:  12/29/2018   ID:  Kathy Juarez, Kathy Juarez 10/18/1955, MRN [id number]  Patient Location:  Home  Provider location:   Office    Chief Complaint:  HTN f/u  History of Present Illness:    Kathy Juarez is a 63 y.o. female who presents via video conferencing for a telehealth visit today.    The patient does not have symptoms concerning for COVID-19 infection (fever, chills, cough, or new shortness of breath).   She presents today for virtual visit. She prefers this method of contact due to COVID-19 pandemic.  Hypertension This is a chronic problem. The current episode started more than 1 year ago. The problem is unchanged. The problem is controlled. Pertinent negatives include no blurred vision, chest pain, palpitations or shortness of breath. Risk factors for coronary artery disease include obesity,  post-menopausal state, sedentary lifestyle and dyslipidemia. Past treatments include angiotensin blockers and diuretics. The current treatment provides moderate improvement. Compliance problems include exercise.   Hyperlipidemia This is a chronic problem. The problem is controlled. Exacerbating diseases include obesity. Pertinent negatives include no chest pain or shortness of breath. Current antihyperlipidemic treatment includes statins. The current treatment provides moderate improvement of lipids. Compliance problems include adherence to exercise.  Risk factors for coronary artery disease include post-menopausal.     Past Medical History:  Diagnosis Date  . Abnormal EKG   . Allergic rhinitis   . Benign essential hypertension   . Depressive disorder   . Hyperlipidemia   . Obesity   . Vitamin D deficiency    Past Surgical History:  Procedure Laterality Date  . ABDOMINAL HYSTERECTOMY    . CESAREAN SECTION     x 3  . CHOLECYSTECTOMY       Current Meds  Medication Sig  . APPLE CIDER VINEGAR PO Take by mouth.  Marland Kitchen aspirin 325 MG EC tablet Take 325 mg by mouth daily.  Marland Kitchen BLACK CURRANT SEED OIL PO Take by mouth.  . Cholecalciferol (VITAMIN D3) 125 MCG (5000 UT) CAPS Take by mouth daily.  Marland Kitchen CINNAMON PO Take by mouth.  Marland Kitchen GARLIC PO Take by mouth.  . HONEY PO Take by mouth.  . OREGANO PO Take 108 mg by mouth. 4 drops daily  . pravastatin (PRAVACHOL) 40 MG tablet Take 1 tablet (40 mg total) by mouth daily.  Marland Kitchen telmisartan-hydrochlorothiazide (MICARDIS HCT) 80-12.5 MG tablet Take 1 tablet by mouth daily.  . [DISCONTINUED] aspirin  81 MG chewable tablet Chew by mouth daily.  . [DISCONTINUED] pravastatin (PRAVACHOL) 40 MG tablet Take 1 tablet (40 mg total) by mouth daily.  . [DISCONTINUED] telmisartan-hydrochlorothiazide (MICARDIS HCT) 80-12.5 MG tablet   . [DISCONTINUED] Vitamin D, Ergocalciferol, (DRISDOL) 50000 units CAPS capsule      Allergies:   Latex   Social History   Tobacco Use   . Smoking status: Never Smoker  . Smokeless tobacco: Never Used  Substance Use Topics  . Alcohol use: No  . Drug use: No     Family Hx: The patient's family history includes Alcohol abuse in her father; Heart attack in her maternal grandfather; Hypertension in her maternal grandmother and mother; Myasthenia gravis in her sister; Stroke in her maternal grandmother; Ulcers in her maternal grandmother.  ROS:   Please see the history of present illness.    Review of Systems  Constitutional: Negative.   Eyes: Negative for blurred vision.  Respiratory: Negative.  Negative for shortness of breath.   Cardiovascular: Negative.  Negative for chest pain and palpitations.  Gastrointestinal: Negative.   Neurological: Negative.   Psychiatric/Behavioral: Negative.     All other systems reviewed and are negative.   Labs/Other Tests and Data Reviewed:    Recent Labs: No results found for requested labs within last 8760 hours.   Recent Lipid Panel No results found for: CHOL, TRIG, HDL, CHOLHDL, LDLCALC, LDLDIRECT  Wt Readings from Last 3 Encounters:  12/17/17 270 lb (122.5 kg)  12/16/17 270 lb (122.5 kg)  07/03/14 250 lb (113.4 kg)     Exam:    Vital Signs:  Ht '5\' 3"'$  (1.6 m)   BMI 47.83 kg/m     Physical Exam  Constitutional: She is oriented to person, place, and time and well-developed, well-nourished, and in no distress.  HENT:  Head: Normocephalic and atraumatic.  Neck: Normal range of motion.  Pulmonary/Chest: Effort normal.  Neurological: She is alert and oriented to person, place, and time.  Psychiatric: Affect normal.  Nursing note and vitals reviewed.   ASSESSMENT & PLAN:     1. Essential hypertension  Chronic. She will continue with current meds. She agrees to come in later this week for labwork. I will also schedule her for a physical in Nov 2020.  She cancelled the one in April 2020 due to the COVID-19 pandemic.   - CMP14+EGFR; Future  2. Pure  hypercholesterolemia  Chronic. I will check fasting lipid panel and LFTs. She is encouraged to avoid fried foods and to incorporate more exercise into her daily routine.   - Lipid panel; Future  3. Other abnormal glucose  HER A1C HAS BEEN ELEVATED IN THE PAST. I WILL CHECK AN A1C, BMET TODAY. SHE WAS ENCOURAGED TO AVOID SUGARY BEVERAGES AND PROCESSED FOODS INCLUDNG BREADS, RICE AND PASTA.  - Hemoglobin A1c; Future  4. Morbid obesity due to excess calories (HCC)  Importance of achieving optimal weight to decrease risk of cardiovascular disease and cancers was discussed with the patient in full detail. She is encouraged to start slowly - start with 10 minutes twice daily at least three to four days per week and to gradually build to 30 minutes five days weekly. She was given tips to incorporate more activity into her daily routine - take stairs when possible, park farther away from her job, grocery stores, etc.     COVID-19 Education: The signs and symptoms of COVID-19 were discussed with the patient and how to seek care for testing (follow up with  PCP or arrange E-visit).  The importance of social distancing was discussed today.  Patient Risk:   After full review of this patients clinical status, I feel that they are at least moderate risk at this time.  Time:   Today, I have spent 10 minutes/ 30 seconds with the patient with telehealth technology discussing above diagnoses.     Medication Adjustments/Labs and Tests Ordered: Current medicines are reviewed at length with the patient today.  Concerns regarding medicines are outlined above.   Tests Ordered: Orders Placed This Encounter  Procedures  . Lipid panel  . CMP14+EGFR  . Hemoglobin A1c    Medication Changes: Meds ordered this encounter  Medications  . pravastatin (PRAVACHOL) 40 MG tablet    Sig: Take 1 tablet (40 mg total) by mouth daily.    Dispense:  90 tablet    Refill:  1  . telmisartan-hydrochlorothiazide  (MICARDIS HCT) 80-12.5 MG tablet    Sig: Take 1 tablet by mouth daily.    Dispense:  90 tablet    Refill:  1    Disposition:  Follow up in 5 month(s)  Signed, Maximino Greenland, MD

## 2018-12-30 ENCOUNTER — Ambulatory Visit: Payer: Self-pay | Admitting: Internal Medicine

## 2018-12-31 ENCOUNTER — Ambulatory Visit: Payer: BLUE CROSS/BLUE SHIELD

## 2018-12-31 ENCOUNTER — Other Ambulatory Visit: Payer: Self-pay

## 2018-12-31 ENCOUNTER — Other Ambulatory Visit: Payer: BLUE CROSS/BLUE SHIELD

## 2018-12-31 VITALS — BP 122/86 | HR 90 | Temp 98.0°F | Ht 62.4 in | Wt 270.8 lb

## 2018-12-31 DIAGNOSIS — I10 Essential (primary) hypertension: Secondary | ICD-10-CM

## 2018-12-31 DIAGNOSIS — E7800 Pure hypercholesterolemia, unspecified: Secondary | ICD-10-CM

## 2018-12-31 DIAGNOSIS — R7309 Other abnormal glucose: Secondary | ICD-10-CM

## 2018-12-31 NOTE — Progress Notes (Signed)
Pt came in for blood pressure check. Encouraged to call the office for any concerns

## 2019-01-01 LAB — HEMOGLOBIN A1C
Est. average glucose Bld gHb Est-mCnc: 134 mg/dL
Hgb A1c MFr Bld: 6.3 % — ABNORMAL HIGH (ref 4.8–5.6)

## 2019-01-01 LAB — LIPID PANEL
Chol/HDL Ratio: 3.9 ratio (ref 0.0–4.4)
Cholesterol, Total: 173 mg/dL (ref 100–199)
HDL: 44 mg/dL (ref >39)
LDL Calculated: 110 mg/dL — ABNORMAL HIGH (ref 0–99)
Triglycerides: 93 mg/dL (ref 0–149)
VLDL Cholesterol Cal: 19 mg/dL (ref 5–40)

## 2019-01-01 LAB — CMP14+EGFR
ALT: 13 IU/L (ref 0–32)
AST: 12 IU/L (ref 0–40)
Albumin/Globulin Ratio: 1.4 (ref 1.2–2.2)
Albumin: 4 g/dL (ref 3.8–4.8)
Alkaline Phosphatase: 52 IU/L (ref 39–117)
BUN/Creatinine Ratio: 20 (ref 12–28)
BUN: 18 mg/dL (ref 8–27)
Bilirubin Total: 0.4 mg/dL (ref 0.0–1.2)
CO2: 21 mmol/L (ref 20–29)
Calcium: 9.4 mg/dL (ref 8.7–10.3)
Chloride: 104 mmol/L (ref 96–106)
Creatinine, Ser: 0.88 mg/dL (ref 0.57–1.00)
GFR calc Af Amer: 81 mL/min/{1.73_m2} (ref >59)
GFR calc non Af Amer: 71 mL/min/{1.73_m2} (ref >59)
Globulin, Total: 2.9 g/dL (ref 1.5–4.5)
Glucose: 117 mg/dL — ABNORMAL HIGH (ref 65–99)
Potassium: 4.4 mmol/L (ref 3.5–5.2)
Sodium: 142 mmol/L (ref 134–144)
Total Protein: 6.9 g/dL (ref 6.0–8.5)

## 2019-04-08 ENCOUNTER — Encounter: Payer: Self-pay | Admitting: Internal Medicine

## 2019-05-14 ENCOUNTER — Encounter: Payer: BLUE CROSS/BLUE SHIELD | Admitting: Internal Medicine

## 2019-07-08 ENCOUNTER — Ambulatory Visit: Payer: BC Managed Care – PPO | Admitting: Internal Medicine

## 2019-07-08 ENCOUNTER — Other Ambulatory Visit: Payer: Self-pay

## 2019-07-08 ENCOUNTER — Encounter: Payer: Self-pay | Admitting: Internal Medicine

## 2019-07-08 VITALS — BP 118/82 | HR 104 | Temp 98.6°F | Ht 61.8 in | Wt 275.4 lb

## 2019-07-08 DIAGNOSIS — I10 Essential (primary) hypertension: Secondary | ICD-10-CM

## 2019-07-08 DIAGNOSIS — Z1211 Encounter for screening for malignant neoplasm of colon: Secondary | ICD-10-CM | POA: Diagnosis not present

## 2019-07-08 DIAGNOSIS — R7309 Other abnormal glucose: Secondary | ICD-10-CM | POA: Diagnosis not present

## 2019-07-08 DIAGNOSIS — Z0000 Encounter for general adult medical examination without abnormal findings: Secondary | ICD-10-CM

## 2019-07-08 DIAGNOSIS — Z6841 Body Mass Index (BMI) 40.0 and over, adult: Secondary | ICD-10-CM

## 2019-07-08 LAB — POCT UA - MICROALBUMIN
Albumin/Creatinine Ratio, Urine, POC: <30
Creatinine, POC: 300 mg/dL
Microalbumin Ur, POC: 30 mg/L

## 2019-07-08 LAB — POCT URINALYSIS DIPSTICK
Bilirubin, UA: NEGATIVE
Blood, UA: NEGATIVE
Glucose, UA: NEGATIVE
Ketones, UA: NEGATIVE
Leukocytes, UA: NEGATIVE
Nitrite, UA: NEGATIVE
Protein, UA: NEGATIVE
Spec Grav, UA: >=1.03 — AB (ref 1.010–1.025)
Urobilinogen, UA: 0.2 E.U./dL
pH, UA: 5 (ref 5.0–8.0)

## 2019-07-08 MED ORDER — TELMISARTAN-HCTZ 80-12.5 MG PO TABS: 1.0000 | ORAL_TABLET | Freq: Every day | ORAL | 2 refills | Status: DC

## 2019-07-08 NOTE — Progress Notes (Signed)
This visit occurred during the SARS-CoV-2 public health emergency.  Safety protocols were in place, including screening questions prior to the visit, additional usage of staff PPE, and extensive cleaning of exam room while observing appropriate contact time as indicated for disinfecting solutions.  Subjective:     Patient ID: Kathy Juarez , female    DOB: 1955/07/10 , 64 y.o.   MRN: [id number]   Chief Complaint  Patient presents with  . Annual Exam  . Hypertension    HPI  She is here today for a full physical examination. She is no longer followed by GYN. She is s/p hysterectomy. She reports she started to exercise yesterday. She plans to take it "day by day".   Hypertension This is a chronic problem. The current episode started more than 1 year ago. The problem is unchanged. The problem is controlled. Pertinent negatives include no blurred vision or palpitations. Risk factors for coronary artery disease include obesity, post-menopausal state, sedentary lifestyle and dyslipidemia. Past treatments include angiotensin blockers and diuretics. The current treatment provides moderate improvement. Compliance problems include exercise.      Past Medical History:  Diagnosis Date  . Abnormal EKG   . Allergic rhinitis   . Benign essential hypertension   . Depressive disorder   . Hyperlipidemia   . Obesity   . Vitamin D deficiency      Family History  Problem Relation Age of Onset  . Hypertension Mother   . Alcohol abuse Father   . Myasthenia gravis Sister   . Stroke Maternal Grandmother   . Hypertension Maternal Grandmother   . Ulcers Maternal Grandmother   . Heart attack Maternal Grandfather      Current Outpatient Medications:  .  aspirin 325 MG EC tablet, Take 325 mg by mouth daily., Disp: , Rfl:  .  BLACK CURRANT SEED OIL PO, Take by mouth., Disp: , Rfl:  .  Cholecalciferol (VITAMIN D3) 125 MCG (5000 UT) CAPS, Take by mouth daily., Disp: , Rfl:  .  Coenzyme Q10 (COQ10  PO), Take by mouth., Disp: , Rfl:  .  ECHINACEA PO, Take by mouth., Disp: , Rfl:  .  GARLIC PO, Take by mouth., Disp: , Rfl:  .  HONEY PO, Take by mouth., Disp: , Rfl:  .  OREGANO PO, Take 108 mg by mouth. 4 drops daily, Disp: , Rfl:  .  pravastatin (PRAVACHOL) 40 MG tablet, Take 1 tablet (40 mg total) by mouth daily., Disp: 90 tablet, Rfl: 1 .  telmisartan-hydrochlorothiazide (MICARDIS HCT) 80-12.5 MG tablet, Take 1 tablet by mouth daily., Disp: 90 tablet, Rfl: 1   Allergies  Allergen Reactions  . Latex Itching     The patient states she uses status post hysterectomy for birth control. Last LMP was No LMP recorded. Patient has had a hysterectomy.. Negative for Dysmenorrhea jNegative for: breast discharge, breast lump(s), breast pain and breast self exam. Associated symptoms include abnormal vaginal bleeding. Pertinent negatives include abnormal bleeding (hematology), anxiety, decreased libido, depression, difficulty falling sleep, dyspareunia, history of infertility, nocturia, sexual dysfunction, sleep disturbances, urinary incontinence, urinary urgency, vaginal discharge and vaginal itching. Diet regular.The patient states her exercise level is  minimal.   . The patient's tobacco use is:  Social History   Tobacco Use  Smoking Status Never Smoker  Smokeless Tobacco Never Used  . She has been exposed to passive smoke. The patient's alcohol use is:  Social History   Substance and Sexual Activity  Alcohol Use No  Review of Systems  Constitutional: Negative.   HENT: Negative.   Eyes: Negative.  Negative for blurred vision.  Respiratory: Negative.   Cardiovascular: Negative.  Negative for palpitations.  Endocrine: Negative.   Genitourinary: Negative.   Musculoskeletal: Negative.   Skin: Negative.   Allergic/Immunologic: Negative.   Neurological: Negative.   Hematological: Negative.   Psychiatric/Behavioral: Negative.      Today's Vitals   07/08/19 0853  BP: 118/82  Pulse:  (!) 104  Temp: 98.6 F (37 C)  TempSrc: Oral  Weight: 275 lb 6.4 oz (124.9 kg)  Height: 5' 1.8" (1.57 m)   Body mass index is 50.7 kg/m.   Objective:  Physical Exam Vitals and nursing note reviewed.  Constitutional:      Appearance: Normal appearance. She is obese.  HENT:     Head: Normocephalic and atraumatic.     Right Ear: Tympanic membrane, ear canal and external ear normal.     Left Ear: Tympanic membrane, ear canal and external ear normal.     Nose:     Comments: Deferred, masked    Mouth/Throat:     Comments: Deferred, masked Eyes:     Extraocular Movements: Extraocular movements intact.     Conjunctiva/sclera: Conjunctivae normal.     Pupils: Pupils are equal, round, and reactive to light.  Cardiovascular:     Rate and Rhythm: Normal rate and regular rhythm.     Pulses: Normal pulses.     Heart sounds: Normal heart sounds.  Pulmonary:     Effort: Pulmonary effort is normal.     Breath sounds: Normal breath sounds.  Chest:     Breasts: Tanner Score is 5.        Right: Normal.        Left: Normal.  Abdominal:     General: Bowel sounds are normal.     Palpations: Abdomen is soft.     Comments: Obese, soft.  Genitourinary:    Comments: deferred Musculoskeletal:        General: Normal range of motion.     Cervical back: Normal range of motion and neck supple.  Skin:    General: Skin is warm and dry.  Neurological:     General: No focal deficit present.     Mental Status: She is alert and oriented to person, place, and time.  Psychiatric:        Mood and Affect: Mood normal.        Behavior: Behavior normal.         Assessment And Plan:     1. Routine general medical examination at health care facility  A full exam was performed.  Importance of monthly self breast exams was discussed with the patient. PATIENT IS ADVISED TO GET 30-45 MINUTES REGULAR EXERCISE NO LESS THAN FOUR TO FIVE DAYS PER WEEK - BOTH WEIGHTBEARING EXERCISES AND AEROBIC ARE  RECOMMENDED.  SHE IS ADVISED TO FOLLOW A HEALTHY DIET WITH AT LEAST SIX FRUITS/VEGGIES PER DAY, DECREASE INTAKE OF RED MEAT, AND TO INCREASE FISH INTAKE TO TWO DAYS PER WEEK.  MEATS/FISH SHOULD NOT BE FRIED, BAKED OR BROILED IS PREFERABLE.  I SUGGEST WEARING SPF 50 SUNSCREEN ON EXPOSED PARTS AND ESPECIALLY WHEN IN THE DIRECT SUNLIGHT FOR AN EXTENDED PERIOD OF TIME.  PLEASE AVOID FAST FOOD RESTAURANTS AND INCREASE YOUR WATER INTAKE.  - CMP14+EGFR - CBC - Lipid panel - Hemoglobin A1c - TSH  2. Essential hypertension  Chronic, well controlled. She will continue with current meds. She is  encouraged to avoid adding salt to her foods. 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 She will rto in six months for re-evaluation.   - POCT Urinalysis Dipstick (81002) - POCT UA - Microalbumin - EKG 12-Lead  3. Class 3 severe obesity due to excess calories with serious comorbidity and body mass index (BMI) of 50.0 to 59.9 in adult [facility])  She is encouraged to strive to lose ten percent of her body weight to decrease cardiac risk. Once she reaches this goal, we will set a new goal. She is encouraged to aim for at least 150 minutes of exercise per week.   4. Screen for colon cancer  Last colonoscopy was in 2011. I will refer her to [facility] for CRC screening.   - Ambulatory referral to Gastroenterology   Maximino Greenland, MD    THE PATIENT IS ENCOURAGED TO PRACTICE SOCIAL DISTANCING DUE TO THE COVID-19 PANDEMIC.

## 2019-07-08 NOTE — Patient Instructions (Signed)
Health Maintenance, Female Adopting a healthy lifestyle and getting preventive care are important in promoting health and wellness. Ask your health care provider about:  The right schedule for you to have regular tests and exams.  Things you can do on your own to prevent diseases and keep yourself healthy. What should I know about diet, weight, and exercise? Eat a healthy diet   Eat a diet that includes plenty of vegetables, fruits, low-fat dairy products, and lean protein.  Do not eat a lot of foods that are high in solid fats, added sugars, or sodium. Maintain a healthy weight Body mass index (BMI) is used to identify weight problems. It estimates body fat based on height and weight. Your health care provider can help determine your BMI and help you achieve or maintain a healthy weight. Get regular exercise Get regular exercise. This is one of the most important things you can do for your health. Most adults should:  Exercise for at least 150 minutes each week. The exercise should increase your heart rate and make you sweat (moderate-intensity exercise).  Do strengthening exercises at least twice a week. This is in addition to the moderate-intensity exercise.  Spend less time sitting. Even light physical activity can be beneficial. Watch cholesterol and blood lipids Have your blood tested for lipids and cholesterol at 64 years of age, then have this test every 5 years. Have your cholesterol levels checked more often if:  Your lipid or cholesterol levels are high.  You are older than 64 years of age.  You are at high risk for heart disease. What should I know about cancer screening? Depending on your health history and family history, you may need to have cancer screening at various ages. This may include screening for:  Breast cancer.  Cervical cancer.  Colorectal cancer.  Skin cancer.  Lung cancer. What should I know about heart disease, diabetes, and high blood  pressure? Blood pressure and heart disease  High blood pressure causes heart disease and increases the risk of stroke. This is more likely to develop in people who have high blood pressure readings, are of African descent, or are overweight.  Have your blood pressure checked: ? Every 3-5 years if you are 18-39 years of age. ? Every year if you are 40 years old or older. Diabetes Have regular diabetes screenings. This checks your fasting blood sugar level. Have the screening done:  Once every three years after age 40 if you are at a normal weight and have a low risk for diabetes.  More often and at a younger age if you are overweight or have a high risk for diabetes. What should I know about preventing infection? Hepatitis B If you have a higher risk for hepatitis B, you should be screened for this virus. Talk with your health care provider to find out if you are at risk for hepatitis B infection. Hepatitis C Testing is recommended for:  Everyone born from 1945 through 1965.  Anyone with known risk factors for hepatitis C. Sexually transmitted infections (STIs)  Get screened for STIs, including gonorrhea and chlamydia, if: ? You are sexually active and are younger than 64 years of age. ? You are older than 64 years of age and your health care provider tells you that you are at risk for this type of infection. ? Your sexual activity has changed since you were last screened, and you are at increased risk for chlamydia or gonorrhea. Ask your health care provider if   you are at risk.  Ask your health care provider about whether you are at high risk for HIV. Your health care provider may recommend a prescription medicine to help prevent HIV infection. If you choose to take medicine to prevent HIV, you should first get tested for HIV. You should then be tested every 3 months for as long as you are taking the medicine. Pregnancy  If you are about to stop having your period (premenopausal) and  you may become pregnant, seek counseling before you get pregnant.  Take 400 to 800 micrograms (mcg) of folic acid every day if you become pregnant.  Ask for birth control (contraception) if you want to prevent pregnancy. Osteoporosis and menopause Osteoporosis is a disease in which the bones lose minerals and strength with aging. This can result in bone fractures. If you are 65 years old or older, or if you are at risk for osteoporosis and fractures, ask your health care provider if you should:  Be screened for bone loss.  Take a calcium or vitamin D supplement to lower your risk of fractures.  Be given hormone replacement therapy (HRT) to treat symptoms of menopause. Follow these instructions at home: Lifestyle  Do not use any products that contain nicotine or tobacco, such as cigarettes, e-cigarettes, and chewing tobacco. If you need help quitting, ask your health care provider.  Do not use street drugs.  Do not share needles.  Ask your health care provider for help if you need support or information about quitting drugs. Alcohol use  Do not drink alcohol if: ? Your health care provider tells you not to drink. ? You are pregnant, may be pregnant, or are planning to become pregnant.  If you drink alcohol: ? Limit how much you use to 0-1 drink a day. ? Limit intake if you are breastfeeding.  Be aware of how much alcohol is in your drink. In the [place] one drink equals one 12 oz bottle of beer (355 mL), one 5 oz glass of wine (148 mL), or one 1 oz glass of hard liquor (44 mL). General instructions  Schedule regular health, dental, and eye exams.  Stay current with your vaccines.  Tell your health care provider if: ? You often feel depressed. ? You have ever been abused or do not feel safe at home. Summary  Adopting a healthy lifestyle and getting preventive care are important in promoting health and wellness.  Follow your health care provider's instructions about healthy  diet, exercising, and getting tested or screened for diseases.  Follow your health care provider's instructions on monitoring your cholesterol and blood pressure. This information is not intended to replace advice given to you by your health care provider. Make sure you discuss any questions you have with your health care provider. Document Revised: 06/11/2018 Document Reviewed: 06/11/2018 Elsevier Patient Education  2020 [employer]  

## 2019-07-09 LAB — CBC
Hematocrit: 41 % (ref 34.0–46.6)
Hemoglobin: 14.3 g/dL (ref 11.1–15.9)
MCH: 32.6 pg (ref 26.6–33.0)
MCHC: 34.9 g/dL (ref 31.5–35.7)
MCV: 93 fL (ref 79–97)
Platelets: 267 10*3/uL (ref 150–450)
RBC: 4.39 x10E6/uL (ref 3.77–5.28)
RDW: 12.3 % (ref 11.7–15.4)
WBC: 5.8 10*3/uL (ref 3.4–10.8)

## 2019-07-09 LAB — LIPID PANEL
Chol/HDL Ratio: 4.1 ratio (ref 0.0–4.4)
Cholesterol, Total: 182 mg/dL (ref 100–199)
HDL: 44 mg/dL (ref >39)
LDL Chol Calc (NIH): 117 mg/dL — ABNORMAL HIGH (ref 0–99)
Triglycerides: 117 mg/dL (ref 0–149)
VLDL Cholesterol Cal: 21 mg/dL (ref 5–40)

## 2019-07-09 LAB — CMP14+EGFR
ALT: 13 IU/L (ref 0–32)
AST: 13 IU/L (ref 0–40)
Albumin/Globulin Ratio: 1.3 (ref 1.2–2.2)
Albumin: 4 g/dL (ref 3.8–4.8)
Alkaline Phosphatase: 59 IU/L (ref 39–117)
BUN/Creatinine Ratio: 19 (ref 12–28)
BUN: 18 mg/dL (ref 8–27)
Bilirubin Total: 0.5 mg/dL (ref 0.0–1.2)
CO2: 22 mmol/L (ref 20–29)
Calcium: 9.6 mg/dL (ref 8.7–10.3)
Chloride: 103 mmol/L (ref 96–106)
Creatinine, Ser: 0.96 mg/dL (ref 0.57–1.00)
GFR calc Af Amer: 73 mL/min/{1.73_m2} (ref >59)
GFR calc non Af Amer: 63 mL/min/{1.73_m2} (ref >59)
Globulin, Total: 3 g/dL (ref 1.5–4.5)
Glucose: 119 mg/dL — ABNORMAL HIGH (ref 65–99)
Potassium: 4.2 mmol/L (ref 3.5–5.2)
Sodium: 139 mmol/L (ref 134–144)
Total Protein: 7 g/dL (ref 6.0–8.5)

## 2019-07-09 LAB — TSH: TSH: 1.96 u[IU]/mL (ref 0.450–4.500)

## 2019-07-09 LAB — HEMOGLOBIN A1C
Est. average glucose Bld gHb Est-mCnc: 137 mg/dL
Hgb A1c MFr Bld: 6.4 % — ABNORMAL HIGH (ref 4.8–5.6)

## 2019-07-17 IMAGING — NM NM MYOCAR PERF WALL MOTION
9 series · 54 of 54 positions shown · non-contrast
Comparison: none

[Series 1: wbr stress-gsp · 6.40mm/px · 6 of 511 frames shown]
[frame 43/511  full-range]
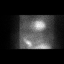
[frame 128/511  full-range]
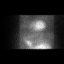
[frame 213/511  full-range]
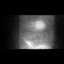
[frame 298/511  full-range]
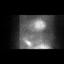
[frame 383/511  full-range]
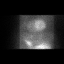
[frame 469/511  full-range]
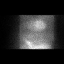

[Series 1: wbr_s-proj_st wbr stress-gsp · 6.40mm/px · 6 of 512 frames shown]
[frame 43/512]
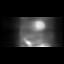
[frame 128/512]
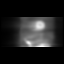
[frame 214/512]
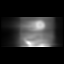
[frame 299/512]
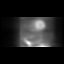
[frame 384/512]
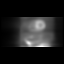
[frame 470/512]
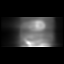

[Series 1: stress sax gs · 6.4mm · 6.40mm/px · 6 of 168 frames shown]
[frame 15/168]
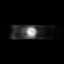
[frame 43/168]
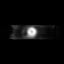
[frame 71/168]
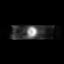
[frame 99/168]
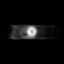
[frame 127/168]
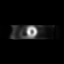
[frame 155/168]
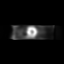

[Series 2: wbr_s-proj_st wbr stress-sum-em · 6.40mm/px · 6 of 64 frames shown]
[frame 6/64]
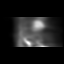
[frame 16/64]
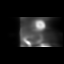
[frame 27/64]
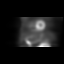
[frame 38/64]
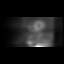
[frame 48/64]
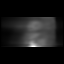
[frame 59/64]
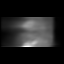

[Series 2: stress sax · 6.4mm · 6.40mm/px · 6 of 21 frames shown]
[frame 2/21]
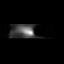
[frame 6/21]
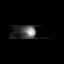
[frame 9/21]
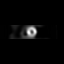
[frame 13/21]
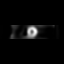
[frame 16/21]
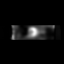
[frame 20/21]
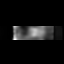

[Series 2: wbr stress-sum-em · 6.40mm/px · 6 of 64 frames shown]
[frame 6/64]
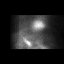
[frame 16/64]
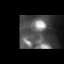
[frame 27/64]
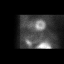
[frame 38/64]
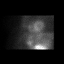
[frame 48/64]
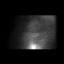
[frame 59/64]
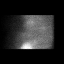

[Series 3: rest sax · 6.4mm · 6.40mm/px · 6 of 23 frames shown]
[frame 2/23]
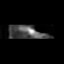
[frame 6/23]
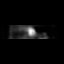
[frame 10/23]
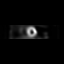
[frame 14/23]
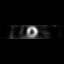
[frame 18/23]
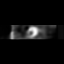
[frame 22/23]
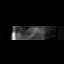

[Series 3: wbr_r-proj_st wbr rest · 6.40mm/px · 6 of 64 frames shown]
[frame 6/64]
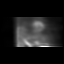
[frame 16/64]
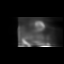
[frame 27/64]
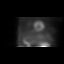
[frame 38/64]
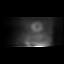
[frame 48/64]
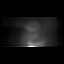
[frame 59/64]
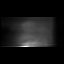

[Series 3: wbr rest · 6.40mm/px · 6 of 64 frames shown]
[frame 6/64]
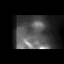
[frame 16/64]
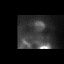
[frame 27/64]
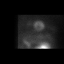
[frame 38/64]
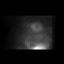
[frame 48/64]
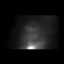
[frame 59/64]
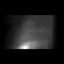

[54 of 54 positions shown; findings below may reference images not displayed]

Canned report from images found in remote index.

Refer to host system for actual result text.

## 2019-09-25 ENCOUNTER — Other Ambulatory Visit: Payer: Self-pay | Admitting: Internal Medicine

## 2019-09-25 DIAGNOSIS — Z1231 Encounter for screening mammogram for malignant neoplasm of breast: Secondary | ICD-10-CM

## 2019-10-01 ENCOUNTER — Ambulatory Visit
Admission: RE | Admit: 2019-10-01 | Discharge: 2019-10-01 | Disposition: A | Discharge status: Home / Self Care | Payer: BC Managed Care – PPO | Source: Ambulatory Visit

## 2019-10-01 ENCOUNTER — Other Ambulatory Visit: Payer: Self-pay

## 2019-10-01 DIAGNOSIS — Z1231 Encounter for screening mammogram for malignant neoplasm of breast: Secondary | ICD-10-CM

## 2019-11-18 DIAGNOSIS — Z01818 Encounter for other preprocedural examination: Secondary | ICD-10-CM | POA: Diagnosis not present

## 2019-12-25 DIAGNOSIS — Z03818 Encounter for observation for suspected exposure to other biological agents ruled out: Secondary | ICD-10-CM | POA: Diagnosis not present

## 2019-12-30 DIAGNOSIS — K5730 Diverticulosis of large intestine without perforation or abscess without bleeding: Secondary | ICD-10-CM | POA: Diagnosis not present

## 2019-12-30 DIAGNOSIS — Z1211 Encounter for screening for malignant neoplasm of colon: Secondary | ICD-10-CM | POA: Diagnosis not present

## 2019-12-30 DIAGNOSIS — K6289 Other specified diseases of anus and rectum: Secondary | ICD-10-CM | POA: Diagnosis not present

## 2019-12-30 DIAGNOSIS — K640 First degree hemorrhoids: Secondary | ICD-10-CM | POA: Diagnosis not present

## 2019-12-30 LAB — HM COLONOSCOPY

## 2020-01-05 ENCOUNTER — Encounter: Payer: Self-pay | Admitting: Internal Medicine

## 2020-01-05 ENCOUNTER — Ambulatory Visit: Payer: BC Managed Care – PPO | Admitting: Internal Medicine

## 2020-02-17 ENCOUNTER — Other Ambulatory Visit: Payer: Self-pay

## 2020-02-17 ENCOUNTER — Ambulatory Visit (INDEPENDENT_AMBULATORY_CARE_PROVIDER_SITE_OTHER): Payer: BC Managed Care – PPO | Admitting: Internal Medicine

## 2020-02-17 ENCOUNTER — Encounter: Payer: Self-pay | Admitting: Internal Medicine

## 2020-02-17 VITALS — BP 122/76 | HR 90 | Temp 97.9°F | Ht 61.8 in | Wt 245.8 lb

## 2020-02-17 DIAGNOSIS — E7800 Pure hypercholesterolemia, unspecified: Secondary | ICD-10-CM

## 2020-02-17 DIAGNOSIS — R7309 Other abnormal glucose: Secondary | ICD-10-CM | POA: Diagnosis not present

## 2020-02-17 DIAGNOSIS — Z6841 Body Mass Index (BMI) 40.0 and over, adult: Secondary | ICD-10-CM

## 2020-02-17 DIAGNOSIS — L680 Hirsutism: Secondary | ICD-10-CM | POA: Diagnosis not present

## 2020-02-17 DIAGNOSIS — I10 Essential (primary) hypertension: Secondary | ICD-10-CM | POA: Diagnosis not present

## 2020-02-17 MED ORDER — WEGOVY 0.5 MG/0.5ML ~~LOC~~ SOAJ: 0.5000 mg | SUBCUTANEOUS | 0 refills | Status: DC

## 2020-02-17 NOTE — Progress Notes (Addendum)
I,Katawbba Wiggins,acting as a Education administrator for Maximino Greenland, MD.,have documented all relevant documentation on the behalf of Maximino Greenland, MD,as directed by  Maximino Greenland, MD while in the presence of Maximino Greenland, MD.  This visit occurred during the SARS-CoV-2 public health emergency.  Safety protocols were in place, including screening questions prior to the visit, additional usage of staff PPE, and extensive cleaning of exam room while observing appropriate contact time as indicated for disinfecting solutions.  Subjective:     Patient ID: Kathy Juarez , female    DOB: 27-Feb-1956 , 64 y.o.   MRN: [id number]   Chief Complaint  Patient presents with  . Hypertension    HPI  The patient is here today for a follow-up on her blood pressure and cholesterol.  She reports compliance with meds. She has been going to [facility] and is followed by Dr. Waunita Schooner. She is pleased with her weight loss thus far. She admits that she is not yet exercising as much as she should.   Hypertension This is a chronic problem. The current episode started more than 1 year ago. The problem is unchanged. The problem is controlled. Pertinent negatives include no blurred vision, chest pain, palpitations or shortness of breath. Risk factors for coronary artery disease include obesity, post-menopausal state, sedentary lifestyle and dyslipidemia. Past treatments include angiotensin blockers and diuretics. The current treatment provides moderate improvement. Compliance problems include exercise.   Hyperlipidemia This is a chronic problem. The problem is controlled. Exacerbating diseases include obesity. Pertinent negatives include no chest pain or shortness of breath. Current antihyperlipidemic treatment includes statins. The current treatment provides moderate improvement of lipids. Compliance problems include adherence to exercise.  Risk factors for coronary artery disease include post-menopausal.      Past Medical History:  Diagnosis Date  . Abnormal EKG   . Allergic rhinitis   . Benign essential hypertension   . Depressive disorder   . Hyperlipidemia   . Obesity   . Vitamin D deficiency      Family History  Problem Relation Age of Onset  . Hypertension Mother   . Alcohol abuse Father   . Myasthenia gravis Sister   . Stroke Maternal Grandmother   . Hypertension Maternal Grandmother   . Ulcers Maternal Grandmother   . Heart attack Maternal Grandfather      Current Outpatient Medications:  .  aspirin 325 MG EC tablet, Take 325 mg by mouth daily., Disp: , Rfl:  .  Cholecalciferol (VITAMIN D3) 125 MCG (5000 UT) CAPS, Take by mouth daily., Disp: , Rfl:  .  GARLIC PO, Take by mouth., Disp: , Rfl:  .  metFORMIN (GLUCOPHAGE) 500 MG tablet, Take 500 mg by mouth daily., Disp: , Rfl:  .  pravastatin (PRAVACHOL) 40 MG tablet, Take 1 tablet (40 mg total) by mouth daily., Disp: 90 tablet, Rfl: 1 .  telmisartan-hydrochlorothiazide (MICARDIS HCT) 80-12.5 MG tablet, Take 1 tablet by mouth daily., Disp: 90 tablet, Rfl: 2 .  UNABLE TO FIND, Breakfast Burner supplements [facility], Disp: , Rfl:  .  UNABLE TO FIND, Morning Burn [facility], Disp: , Rfl:  .  UNABLE TO FIND, Super Lipo Plus [facility], Disp: , Rfl:  .  UNABLE TO FIND, 5htp tabs before lunch [facility], Disp: , Rfl:  .  BLACK CURRANT SEED OIL PO, Take by mouth. (Patient not taking: Reported on 02/17/2020), Disp: , Rfl:  .  Coenzyme Q10 (COQ10 PO), Take by mouth. (Patient not taking: Reported on 02/17/2020), Disp: , Rfl:  .  ECHINACEA PO, Take by mouth. (Patient not taking: Reported on 02/17/2020), Disp: , Rfl:  .  HONEY PO, Take by mouth. (Patient not taking: Reported on 02/17/2020), Disp: , Rfl:  .  OREGANO PO, Take 108 mg by mouth. 4 drops daily (Patient not taking: Reported on 02/17/2020), Disp: , Rfl:  .  Semaglutide-Weight Management  (WEGOVY) 0.5 MG/0.5ML SOAJ, Inject 0.5 mg into the skin once a week., Disp: 2 mL, Rfl: 0   Allergies  Allergen Reactions  . Latex Itching     Review of Systems  Constitutional: Negative.   Eyes: Negative for blurred vision.  Respiratory: Negative.  Negative for shortness of breath.   Cardiovascular: Negative.  Negative for chest pain and palpitations.  Gastrointestinal: Negative.   Psychiatric/Behavioral: Negative.   All other systems reviewed and are negative.    Today's Vitals   02/17/20 1203  BP: 122/76  Pulse: 90  Temp: 97.9 F (36.6 C)  TempSrc: Oral  Weight: 245 lb 12.8 oz (111.5 kg)  Height: 5' 1.8" (1.57 m)   Body mass index is 45.25 kg/m.  Wt Readings from Last 3 Encounters:  02/17/20 245 lb 12.8 oz (111.5 kg)  07/08/19 275 lb 6.4 oz (124.9 kg)  12/31/18 270 lb 12.8 oz (122.8 kg)   Objective:  Physical Exam Vitals and nursing note reviewed.  Constitutional:      Appearance: Normal appearance. She is obese.  HENT:     Head: Normocephalic and atraumatic.  Cardiovascular:     Rate and Rhythm: Normal rate and regular rhythm.     Heart sounds: Normal heart sounds.  Pulmonary:     Breath sounds: Normal breath sounds.  Skin:    General: Skin is warm.  Neurological:     General: No focal deficit present.     Mental Status: She is alert and oriented to person, place, and time.         Assessment And Plan:     1. Essential hypertension Comments: Chronic, well controlled. She will continue with current meds. She is encouraged to avoid adding salt to her foods. She will rto in six months for a physical.  - Lipid panel - CMP14+EGFR  2. Pure hypercholesterolemia Comments: Chronic, I will check non-fasting lipid panel. Encouraged to avoid fried foods and to incorporate more exercise into her daily routine.  - Lipid panel - CMP14+EGFR  3. Other abnormal glucose  HER A1C HAS BEEN ELEVATED IN THE PAST. I WILL CHECK AN A1C, BMET TODAY. SHE WAS ENCOURAGED TO  AVOID SUGARY BEVERAGES AND PROCESSED FOODS INCLUDNG BREADS, RICE AND PASTA.  - Hemoglobin A1c - Insulin, random(561)  4. Hirsutism Comments: I will check serum testosterone level. I will also refer her to [facility] as requested.  - Ambulatory referral to Dermatology - Testosterone, Total  5. Class 3 severe obesity due to excess calories with serious comorbidity and body mass index (BMI) of 45.0 to 49.9 in adult (HCC)   BMI 45. She was congratulated on her 30 pound weight loss since Jan 2021. She is encouraged to strive for BMI less than 35 to decrease cardiac risk. Advised to aim for at least 150 minutes of exercise per week. We also discussed the use of Wegovy for obesity. She denies family/personal h/o thyroid cancer. She was given 0.83m samples and advised how to self administer the medication. She understands that she will not take her  next dose until next week. She is reminded to stop eating when full. She will rto in 4 weeks for re-evaluation.  Possible side effects d/w patient.     Patient was given opportunity to ask questions. Patient verbalized understanding of the plan and was able to repeat key elements of the plan. All questions were answered to their satisfaction.  Maximino Greenland, MD   I, Maximino Greenland, MD, have reviewed all documentation for this visit. The documentation on 02/22/20 for the exam, diagnosis, procedures, and orders are all accurate and complete.  THE PATIENT IS ENCOURAGED TO PRACTICE SOCIAL DISTANCING DUE TO THE COVID-19 PANDEMIC.

## 2020-02-17 NOTE — Patient Instructions (Signed)

## 2020-02-18 LAB — LIPID PANEL
Chol/HDL Ratio: 4.4 ratio (ref 0.0–4.4)
Cholesterol, Total: 181 mg/dL (ref 100–199)
HDL: 41 mg/dL (ref >39)
LDL Chol Calc (NIH): 118 mg/dL — ABNORMAL HIGH (ref 0–99)
Triglycerides: 124 mg/dL (ref 0–149)
VLDL Cholesterol Cal: 22 mg/dL (ref 5–40)

## 2020-02-18 LAB — HEMOGLOBIN A1C
Est. average glucose Bld gHb Est-mCnc: 120 mg/dL
Hgb A1c MFr Bld: 5.8 % — ABNORMAL HIGH (ref 4.8–5.6)

## 2020-02-18 LAB — CMP14+EGFR
ALT: 9 IU/L (ref 0–32)
AST: 10 IU/L (ref 0–40)
Albumin/Globulin Ratio: 1.5 (ref 1.2–2.2)
Albumin: 4.1 g/dL (ref 3.8–4.8)
Alkaline Phosphatase: 55 IU/L (ref 48–121)
BUN/Creatinine Ratio: 25 (ref 12–28)
BUN: 20 mg/dL (ref 8–27)
Bilirubin Total: 0.3 mg/dL (ref 0.0–1.2)
CO2: 25 mmol/L (ref 20–29)
Calcium: 9.5 mg/dL (ref 8.7–10.3)
Chloride: 102 mmol/L (ref 96–106)
Creatinine, Ser: 0.8 mg/dL (ref 0.57–1.00)
GFR calc Af Amer: 91 mL/min/{1.73_m2} (ref >59)
GFR calc non Af Amer: 79 mL/min/{1.73_m2} (ref >59)
Globulin, Total: 2.7 g/dL (ref 1.5–4.5)
Glucose: 90 mg/dL (ref 65–99)
Potassium: 4.3 mmol/L (ref 3.5–5.2)
Sodium: 140 mmol/L (ref 134–144)
Total Protein: 6.8 g/dL (ref 6.0–8.5)

## 2020-02-18 LAB — TESTOSTERONE: Testosterone: 16 ng/dL (ref 3–67)

## 2020-02-18 LAB — INSULIN, RANDOM: INSULIN: 38.2 u[IU]/mL — ABNORMAL HIGH (ref 2.6–24.9)

## 2020-04-07 ENCOUNTER — Other Ambulatory Visit: Payer: BC Managed Care – PPO

## 2020-04-07 DIAGNOSIS — Z20822 Contact with and (suspected) exposure to covid-19: Secondary | ICD-10-CM

## 2020-04-08 LAB — NOVEL CORONAVIRUS, NAA: SARS-CoV-2, NAA: NOT DETECTED

## 2020-04-08 LAB — SARS-COV-2, NAA 2 DAY TAT

## 2020-05-25 DIAGNOSIS — L680 Hirsutism: Secondary | ICD-10-CM | POA: Diagnosis not present

## 2020-05-25 DIAGNOSIS — L739 Follicular disorder, unspecified: Secondary | ICD-10-CM | POA: Diagnosis not present

## 2020-05-25 DIAGNOSIS — L819 Disorder of pigmentation, unspecified: Secondary | ICD-10-CM | POA: Diagnosis not present

## 2020-05-25 DIAGNOSIS — D2239 Melanocytic nevi of other parts of face: Secondary | ICD-10-CM | POA: Diagnosis not present

## 2020-07-18 ENCOUNTER — Encounter: Payer: BC Managed Care – PPO | Admitting: Internal Medicine

## 2020-07-22 ENCOUNTER — Encounter: Payer: BC Managed Care – PPO | Admitting: Nurse Practitioner

## 2020-07-22 ENCOUNTER — Encounter: Payer: BC Managed Care – PPO | Admitting: Internal Medicine

## 2020-08-02 ENCOUNTER — Other Ambulatory Visit: Payer: BC Managed Care – PPO

## 2020-08-02 DIAGNOSIS — Z20822 Contact with and (suspected) exposure to covid-19: Secondary | ICD-10-CM

## 2020-08-04 LAB — SARS-COV-2, NAA 2 DAY TAT

## 2020-08-04 LAB — NOVEL CORONAVIRUS, NAA: SARS-CoV-2, NAA: NOT DETECTED

## 2020-08-08 ENCOUNTER — Other Ambulatory Visit: Payer: BC Managed Care – PPO

## 2020-08-08 DIAGNOSIS — Z20822 Contact with and (suspected) exposure to covid-19: Secondary | ICD-10-CM | POA: Diagnosis not present

## 2020-08-09 LAB — SARS-COV-2, NAA 2 DAY TAT

## 2020-08-09 LAB — NOVEL CORONAVIRUS, NAA: SARS-CoV-2, NAA: DETECTED — AB

## 2020-08-10 ENCOUNTER — Telehealth: Payer: Self-pay | Admitting: *Deleted

## 2020-08-10 NOTE — Telephone Encounter (Signed)
Called to discuss with patient about COVID-19 symptoms and the use of one of the available treatments for those with mild to moderate Covid symptoms and at a high risk of hospitalization.  Pt appears to qualify for outpatient treatment due to co-morbid conditions and/or a member of an at-risk group in accordance with the FDA Emergency Use Authorization.    Symptom onset:Unknown Vaccinated:Yes Booster?  Immunocompromised?  Qualifiers: Heart disease, BMI.25, African American  Unable to reach pt - Left VM to return call for information.  Karsten Fells

## 2020-08-10 NOTE — Telephone Encounter (Signed)
Called to discuss with patient about COVID-19 symptoms and the use of one of the available treatments for those with mild to moderate Covid symptoms and at a high risk of hospitalization.  Pt appears to qualify for outpatient treatment due to co-morbid conditions and/or a member of an at-risk group in accordance with the FDA Emergency Use Authorization.      Symptom onset: ? Vaccinated: Yes Booster?  Immunocompromised?  Qualifiers: heart disease, BMI>25, African American.  Unable to reach pt - Left VM to return call for information.   Karsten Fells

## 2020-11-11 ENCOUNTER — Other Ambulatory Visit: Payer: Self-pay | Admitting: Internal Medicine

## 2020-11-11 DIAGNOSIS — Z1231 Encounter for screening mammogram for malignant neoplasm of breast: Secondary | ICD-10-CM

## 2020-11-24 ENCOUNTER — Other Ambulatory Visit: Payer: BC Managed Care – PPO

## 2021-01-09 ENCOUNTER — Inpatient Hospital Stay: Admission: RE | Admit: 2021-01-09 | Payer: BC Managed Care – PPO | Source: Ambulatory Visit

## 2021-01-09 DIAGNOSIS — Z1231 Encounter for screening mammogram for malignant neoplasm of breast: Secondary | ICD-10-CM

## 2021-03-27 ENCOUNTER — Other Ambulatory Visit: Payer: Self-pay

## 2021-03-27 MED ORDER — PRAVASTATIN SODIUM 40 MG PO TABS: 40.0000 mg | ORAL_TABLET | Freq: Every day | ORAL | 1 refills | Status: DC

## 2021-04-17 ENCOUNTER — Other Ambulatory Visit: Payer: Self-pay | Admitting: Internal Medicine

## 2021-04-17 DIAGNOSIS — Z1231 Encounter for screening mammogram for malignant neoplasm of breast: Secondary | ICD-10-CM

## 2021-04-27 ENCOUNTER — Ambulatory Visit: Payer: BC Managed Care – PPO | Admitting: Family

## 2021-04-28 ENCOUNTER — Other Ambulatory Visit: Payer: Self-pay

## 2021-04-28 ENCOUNTER — Ambulatory Visit: Payer: BC Managed Care – PPO | Admitting: Family

## 2021-04-28 ENCOUNTER — Encounter: Payer: Self-pay | Admitting: Family

## 2021-04-28 VITALS — BP 120/80 | HR 86 | Temp 97.1°F | Resp 16 | Ht 61.8 in | Wt 274.0 lb

## 2021-04-28 DIAGNOSIS — E559 Vitamin D deficiency, unspecified: Secondary | ICD-10-CM | POA: Diagnosis not present

## 2021-04-28 DIAGNOSIS — I10 Essential (primary) hypertension: Secondary | ICD-10-CM

## 2021-04-28 DIAGNOSIS — Z7689 Persons encountering health services in other specified circumstances: Secondary | ICD-10-CM

## 2021-04-28 DIAGNOSIS — J4520 Mild intermittent asthma, uncomplicated: Secondary | ICD-10-CM

## 2021-04-28 DIAGNOSIS — Z6841 Body Mass Index (BMI) 40.0 and over, adult: Secondary | ICD-10-CM

## 2021-04-28 DIAGNOSIS — E782 Mixed hyperlipidemia: Secondary | ICD-10-CM | POA: Diagnosis not present

## 2021-04-28 MED ORDER — TELMISARTAN-HCTZ 80-12.5 MG PO TABS: 1.0000 | ORAL_TABLET | Freq: Every day | ORAL | 1 refills | Status: DC

## 2021-04-28 NOTE — Progress Notes (Signed)
Provider: Marlowe Sax FNP-C   No primary care provider on file.  No care team member to display  Extended Emergency Contact Information Primary Emergency Contact: [facility] Address: [place],  [place] Home Phone: [phone] Relation: Mother Secondary Emergency Contact: Margaretann Loveless [place] Mobile Phone: [phone] Relation: Son  Code Status:  Full Code  Goals of care: Advanced Directive information Advanced Directives 04/28/2021  Does Patient Have a Medical Advance Directive? No  Would patient like information on creating a medical advance directive? No - Patient declined     Chief Complaint  Patient presents with   Establish Care    New Patient.    HPI:  Pt is a 65 y.o. female seen today to establish care at [facility] [place] for medical management of chronic diseases.Has a medical history of Hypertension ,Hyperlipidemia,Morbid Obesity,Allergic Rhinitis  among others.  States had a cold two weeks ago felt like she was wheezing so used Albuterol  that she was prescribed in the past mild Asthma.  Follows up with weight management for weight loss.Metformin was recommended.Has follow up visit sometimes in November ,2022   States used to exercise by walking in the past.Also trying to watch her diet.plans to join silver sneakers when she turns 65 yrs since she will qualify.     Past Medical History:  Diagnosis Date   Abnormal EKG    Allergic rhinitis    Benign essential hypertension    Depressive disorder    High cholesterol    History of colonoscopy    History of mammogram    Hyperlipidemia    Hypertension    Obesity    Vitamin D deficiency    Past Surgical History:  Procedure Laterality Date   ABDOMINAL HYSTERECTOMY     CESAREAN SECTION     x 3   CHOLECYSTECTOMY      Allergies  Allergen Reactions   Fish Allergy    Latex Itching    Allergies as of 04/28/2021       Reactions   Fish  Allergy    Latex Itching        Medication List        Accurate as of April 28, 2021  2:25 PM. If you have any questions, ask your nurse or doctor.          STOP taking these medications    BLACK CURRANT SEED OIL PO Stopped by: Otis Peak, CMA   COQ10 PO Stopped by: Otis Peak, CMA   ECHINACEA PO Stopped by: Otis Peak, CMA   GARLIC PO Stopped by: Otis Peak, CMA   HONEY PO Stopped by: Otis Peak, CMA   metFORMIN 500 MG tablet Commonly known as: GLUCOPHAGE Stopped by: Otis Peak, CMA   OREGANO PO Stopped by: Otis Peak, CMA   UNABLE TO FIND Stopped by: Otis Peak, CMA   UNABLE TO FIND Stopped by: Otis Peak, CMA   UNABLE TO FIND Stopped by: Otis Peak, CMA   UNABLE TO FIND Stopped by: Otis Peak, CMA   Wegovy 0.5 MG/0.5ML Soaj Generic drug: Semaglutide-Weight Management Stopped by: Otis Peak, CMA       TAKE these medications    albuterol 108 (90 Base) MCG/ACT inhaler Commonly known as: VENTOLIN HFA Inhale into the lungs every 6 (six) hours as needed for wheezing or shortness of breath.  aspirin 325 MG EC tablet Take 325 mg by mouth daily.   pravastatin 40 MG tablet Commonly known as: PRAVACHOL Take 1 tablet (40 mg total) by mouth daily.   telmisartan-hydrochlorothiazide 80-12.5 MG tablet Commonly known as: MICARDIS HCT Take 1 tablet by mouth daily.   Vitamin D3 125 MCG (5000 UT) Caps Take by mouth daily.        Review of Systems  Constitutional:  Negative for appetite change, chills, fatigue, fever and unexpected weight change.  HENT:  Negative for congestion, dental problem, ear discharge, ear pain, facial swelling, hearing loss, nosebleeds, postnasal drip, rhinorrhea, sinus pressure, sinus pain, sneezing, sore throat, tinnitus and trouble swallowing.   Eyes:  Positive for visual disturbance. Negative for pain, discharge, redness and itching.        Wears eye glasses  Respiratory:  Negative for chest tightness, shortness of breath and wheezing.        Hx mild asthma   Cardiovascular:  Negative for chest pain, palpitations and leg swelling.  Gastrointestinal:  Negative for abdominal distention, abdominal pain, blood in stool, constipation, diarrhea, nausea and vomiting.  Endocrine: Negative for cold intolerance, heat intolerance, polydipsia, polyphagia and polyuria.  Genitourinary:  Negative for difficulty urinating, dysuria, flank pain, frequency and urgency.  Musculoskeletal:  Negative for arthralgias, back pain, gait problem, joint swelling, myalgias, neck pain and neck stiffness.  Skin:  Negative for color change, pallor, rash and wound.  Neurological:  Negative for dizziness, syncope, speech difficulty, weakness, light-headedness and numbness.       Occasional headache   Hematological:  Does not bruise/bleed easily.  Psychiatric/Behavioral:  Negative for agitation, behavioral problems, confusion, hallucinations, self-injury, sleep disturbance and suicidal ideas. The patient is not nervous/anxious.    Immunization History  Administered Date(s) Administered   Influenza Inj Mdck Quad Pf 03/28/2017   Influenza,inj,Quad PF,6+ Mos 03/13/2015, 04/04/2018   Influenza-Unspecified 04/05/2019, 04/01/2021   Moderna Sars-Covid-2 Vaccination 08/14/2019, 09/14/2019, 05/21/2020, 04/26/2021   Pertinent  Health Maintenance Due  Topic Date Due   MAMMOGRAM  09/30/2021   COLONOSCOPY (Pts 45-45yr Insurance coverage will need to be confirmed)  12/29/2029   INFLUENZA VACCINE  Completed   PAP SMEAR-Modifier  Discontinued   Fall Risk 12/29/2018 12/31/2018 07/08/2019 04/28/2021  Falls in the past year? 0 0 0 0  Was there an injury with Fall? - - - 0  Fall Risk Category Calculator - - - 0  Fall Risk Category - - - Low  Patient Fall Risk Level - - Low fall risk Low fall risk  Patient at Risk for Falls Due to - - - No Fall Risks  Fall risk Follow up  - - - Falls evaluation completed   Functional Status Survey:    Vitals:   04/28/21 1340  BP: 120/80  Pulse: 86  Resp: 16  Temp: (!) 97.1 F (36.2 C)  SpO2: 96%  Weight: 274 lb (124.3 kg)  Height: 5' 1.8" (1.57 m)   Body mass index is 50.44 kg/m. Physical Exam Vitals reviewed.  Constitutional:      General: She is not in acute distress.    Appearance: Normal appearance. She is morbidly obese. She is not ill-appearing or diaphoretic.  HENT:     Head: Normocephalic.     Right Ear: Tympanic membrane, ear canal and external ear normal. There is no impacted cerumen.     Left Ear: Tympanic membrane, ear canal and external ear normal. There is no impacted cerumen.     Nose: Nose normal. No  congestion or rhinorrhea.     Mouth/Throat:     Mouth: Mucous membranes are moist.     Pharynx: Oropharynx is clear. No oropharyngeal exudate or posterior oropharyngeal erythema.  Eyes:     General: No scleral icterus.       Right eye: No discharge.        Left eye: No discharge.     Extraocular Movements: Extraocular movements intact.     Conjunctiva/sclera: Conjunctivae normal.     Pupils: Pupils are equal, round, and reactive to light.  Neck:     Vascular: No carotid bruit.  Cardiovascular:     Rate and Rhythm: Normal rate and regular rhythm.     Pulses: Normal pulses.     Heart sounds: Normal heart sounds. No murmur heard.   No friction rub. No gallop.  Pulmonary:     Effort: Pulmonary effort is normal. No respiratory distress.     Breath sounds: Normal breath sounds. No wheezing, rhonchi or rales.  Chest:     Chest wall: No tenderness.  Abdominal:     General: Bowel sounds are normal. There is no distension.     Palpations: Abdomen is soft. There is no mass.     Tenderness: There is no abdominal tenderness. There is no right CVA tenderness, left CVA tenderness, guarding or rebound.  Musculoskeletal:        General: No swelling or tenderness. Normal range of motion.     Cervical  back: Normal range of motion. No rigidity or tenderness.     Right lower leg: No edema.     Left lower leg: No edema.  Lymphadenopathy:     Cervical: No cervical adenopathy.  Skin:    General: Skin is warm and dry.     Coloration: Skin is not pale.     Findings: No bruising, erythema, lesion or rash.  Neurological:     Mental Status: She is alert and oriented to person, place, and time.     Cranial Nerves: No cranial nerve deficit.     Sensory: No sensory deficit.     Motor: No weakness.     Coordination: Coordination normal.     Gait: Gait normal.  Psychiatric:        Mood and Affect: Mood normal.        Speech: Speech normal.        Behavior: Behavior normal.        Thought Content: Thought content normal.        Judgment: Judgment normal.    Labs reviewed: No results for input(s): NA, K, CL, CO2, GLUCOSE, BUN, CREATININE, CALCIUM, MG, PHOS in the last 8760 hours. No results for input(s): AST, ALT, ALKPHOS, BILITOT, PROT, ALBUMIN in the last 8760 hours. No results for input(s): WBC, NEUTROABS, HGB, HCT, MCV, PLT in the last 8760 hours. Lab Results  Component Value Date   TSH 1.960 07/08/2019   Lab Results  Component Value Date   HGBA1C 5.8 (H) 02/17/2020   Lab Results  Component Value Date   CHOL 181 02/17/2020   HDL 41 02/17/2020   LDLCALC 118 (H) 02/17/2020   TRIG 124 02/17/2020   CHOLHDL 4.4 02/17/2020    Significant Diagnostic Results in last 30 days:  No results found.  Assessment/Plan 1. Encounter to establish care Immunization reviewed up to date except PNA  and Zoster vaccine advised to get Zoster vac at her pharmacy  - CBC with Differential/Platelet; Future - CMP with eGFR([facility]); Future - TSH;  Future - Lipid panel; Future  2. Benign essential hypertension B/p well controlled  Continue on Micardis  - Advised to check Blood pressure at home and record on log provided and notify provider if B/p > 140/90  - telmisartan-hydrochlorothiazide (MICARDIS  HCT) 80-12.5 MG tablet; Take 1 tablet by mouth daily.  Dispense: 30 tablet; Refill: 1 - CBC with Differential/Platelet; Future - CMP with eGFR([facility]); Future - TSH; Future  3. Mixed hyperlipidemia Continue on Pravachol  - Lipid panel; Future  4. Vitamin D deficiency Continue on vitamin D supplement  - Vitamin D, 1,25-dihydroxy; Future  5. Mild intermittent asthma with allergic rhinitis without complication Had wheezing 2 weeks ago but has resolved  Continue on Albuterol as Q 6 hrs needed   6. Morbid obesity with BMI of 50.0-59.9, adult (HCC) - Dietary modification and exercise at least 3 times per week for 30 minutes advised. - additional DASH Eating plan Education information provided on AVS   7. BMI 50.0-59.9, adult (HCC) BMI 50.44  Diet and exercise as above  Will be joining silver sneakers   Family/ staff Communication: Reviewed plan of care with patient verbalized understanding   Labs/tests ordered: - CBC with Differential/Platelet - CMP with eGFR([facility]) - TSH - Lipid panel - Vitamin D, 1,25-dihydroxy; Future  Next Appointment : 4 months for medical management of chronic issues.Fasting Labs in one week or sooner    Sandrea Hughs, NP

## 2021-05-01 ENCOUNTER — Encounter: Payer: Self-pay | Admitting: Internal Medicine

## 2021-05-03 ENCOUNTER — Other Ambulatory Visit: Payer: Self-pay

## 2021-05-03 ENCOUNTER — Other Ambulatory Visit: Payer: BC Managed Care – PPO

## 2021-05-03 DIAGNOSIS — E782 Mixed hyperlipidemia: Secondary | ICD-10-CM | POA: Diagnosis not present

## 2021-05-03 DIAGNOSIS — E559 Vitamin D deficiency, unspecified: Secondary | ICD-10-CM | POA: Diagnosis not present

## 2021-05-03 DIAGNOSIS — I10 Essential (primary) hypertension: Secondary | ICD-10-CM

## 2021-05-03 DIAGNOSIS — Z7689 Persons encountering health services in other specified circumstances: Secondary | ICD-10-CM

## 2021-05-09 ENCOUNTER — Telehealth: Payer: Self-pay | Admitting: *Deleted

## 2021-05-09 ENCOUNTER — Other Ambulatory Visit: Payer: Self-pay

## 2021-05-09 DIAGNOSIS — E782 Mixed hyperlipidemia: Secondary | ICD-10-CM

## 2021-05-09 DIAGNOSIS — R7309 Other abnormal glucose: Secondary | ICD-10-CM

## 2021-05-09 DIAGNOSIS — I10 Essential (primary) hypertension: Secondary | ICD-10-CM

## 2021-05-09 MED ORDER — AZITHROMYCIN 250 MG PO TABS: ORAL_TABLET | ORAL | 0 refills | Status: DC

## 2021-05-09 NOTE — Telephone Encounter (Signed)
Patient notified and agreed.  °Rx sent to pharmacy.  °

## 2021-05-09 NOTE — Telephone Encounter (Signed)
Z-pak 250 mg tablet take 2 tablets ( 500 mg ) by mouth x 1 dose then 250 mg tablet daily x 4 days.

## 2021-05-09 NOTE — Telephone Encounter (Signed)
Patient called and stated that she has a Non Productive cough and requesting something to be sent to pharmacy.   I offered patient an appointment but she stated that she was just seen on 10/28.   Has had since being last seen. Non Productive, No fever and believes it is coming from Sinus Drainage.   Would like something to be called into [facility] for relief.   Please Advise.

## 2021-05-10 LAB — COMPLETE METABOLIC PANEL WITH GFR
AG Ratio: 1.3 (calc) (ref 1.0–2.5)
ALT: 15 U/L (ref 6–29)
AST: 12 U/L (ref 10–35)
Albumin: 3.8 g/dL (ref 3.6–5.1)
Alkaline phosphatase (APISO): 53 U/L (ref 37–153)
BUN: 17 mg/dL (ref 7–25)
CO2: 25 mmol/L (ref 20–32)
Calcium: 9.4 mg/dL (ref 8.6–10.4)
Chloride: 103 mmol/L (ref 98–110)
Creat: 0.87 mg/dL (ref 0.50–1.05)
Globulin: 3 g/dL (calc) (ref 1.9–3.7)
Glucose, Bld: 117 mg/dL — ABNORMAL HIGH (ref 65–99)
Potassium: 4 mmol/L (ref 3.5–5.3)
Sodium: 139 mmol/L (ref 135–146)
Total Bilirubin: 0.5 mg/dL (ref 0.2–1.2)
Total Protein: 6.8 g/dL (ref 6.1–8.1)
eGFR: 74 mL/min/{1.73_m2} (ref >=60)

## 2021-05-10 LAB — LIPID PANEL
Cholesterol: 164 mg/dL (ref <200)
HDL: 42 mg/dL — ABNORMAL LOW (ref >=50)
LDL Cholesterol (Calc): 102 mg/dL (calc) — ABNORMAL HIGH
Non-HDL Cholesterol (Calc): 122 mg/dL (calc) (ref <130)
Total CHOL/HDL Ratio: 3.9 (calc) (ref <5.0)
Triglycerides: 100 mg/dL (ref <150)

## 2021-05-10 LAB — CBC WITH DIFFERENTIAL/PLATELET
Absolute Monocytes: 372 cells/uL (ref 200–950)
Basophils Absolute: 63 cells/uL (ref 0–200)
Basophils Relative: 1 %
Eosinophils Absolute: 290 cells/uL (ref 15–500)
Eosinophils Relative: 4.6 %
HCT: 40.2 % (ref 35.0–45.0)
Hemoglobin: 13.5 g/dL (ref 11.7–15.5)
Lymphs Abs: 2564 cells/uL (ref 850–3900)
MCH: 31.8 pg (ref 27.0–33.0)
MCHC: 33.6 g/dL (ref 32.0–36.0)
MCV: 94.6 fL (ref 80.0–100.0)
MPV: 9.8 fL (ref 7.5–12.5)
Monocytes Relative: 5.9 %
Neutro Abs: 3011 cells/uL (ref 1500–7800)
Neutrophils Relative %: 47.8 %
Platelets: 287 10*3/uL (ref 140–400)
RBC: 4.25 10*6/uL (ref 3.80–5.10)
RDW: 12 % (ref 11.0–15.0)
Total Lymphocyte: 40.7 %
WBC: 6.3 10*3/uL (ref 3.8–10.8)

## 2021-05-10 LAB — HEMOGLOBIN A1C
Hgb A1c MFr Bld: 6 % of total Hgb — ABNORMAL HIGH (ref <5.7)
Mean Plasma Glucose: 126 mg/dL
eAG (mmol/L): 7 mmol/L

## 2021-05-10 LAB — TEST AUTHORIZATION

## 2021-05-10 LAB — TSH: TSH: 1.87 mIU/L (ref 0.40–4.50)

## 2021-05-10 LAB — VITAMIN D 1,25 DIHYDROXY
Vitamin D 1, 25 (OH)2 Total: 38 pg/mL (ref 18–72)
Vitamin D2 1, 25 (OH)2: <8 pg/mL
Vitamin D3 1, 25 (OH)2: 38 pg/mL

## 2021-05-11 DIAGNOSIS — Z1231 Encounter for screening mammogram for malignant neoplasm of breast: Secondary | ICD-10-CM

## 2021-05-12 ENCOUNTER — Other Ambulatory Visit: Payer: Self-pay | Admitting: Family

## 2021-05-12 DIAGNOSIS — Z1231 Encounter for screening mammogram for malignant neoplasm of breast: Secondary | ICD-10-CM

## 2021-05-17 ENCOUNTER — Ambulatory Visit
Admission: RE | Admit: 2021-05-17 | Discharge: 2021-05-17 | Disposition: A | Discharge status: Home / Self Care | Payer: BC Managed Care – PPO | Source: Ambulatory Visit

## 2021-05-17 ENCOUNTER — Other Ambulatory Visit: Payer: Self-pay

## 2021-05-17 DIAGNOSIS — Z1231 Encounter for screening mammogram for malignant neoplasm of breast: Secondary | ICD-10-CM | POA: Diagnosis not present

## 2021-05-19 MED ORDER — ALBUTEROL SULFATE HFA 108 (90 BASE) MCG/ACT IN AERS: 1.0000 | INHALATION_SPRAY | Freq: Four times a day (QID) | RESPIRATORY_TRACT | 1 refills | Status: AC | PRN

## 2021-05-28 ENCOUNTER — Other Ambulatory Visit: Payer: Self-pay | Admitting: Family

## 2021-05-28 DIAGNOSIS — I10 Essential (primary) hypertension: Secondary | ICD-10-CM

## 2021-06-30 ENCOUNTER — Other Ambulatory Visit: Payer: Self-pay | Admitting: Family

## 2021-06-30 DIAGNOSIS — I10 Essential (primary) hypertension: Secondary | ICD-10-CM

## 2021-07-28 ENCOUNTER — Emergency Department (HOSPITAL_COMMUNITY)
Admission: EM | Admit: 2021-07-28 | Discharge: 2021-07-28 | Disposition: A | Discharge status: Home / Self Care | Payer: Medicare Other | Attending: Emergency Medicine | Admitting: Emergency Medicine

## 2021-07-28 ENCOUNTER — Ambulatory Visit: Payer: Self-pay

## 2021-07-28 ENCOUNTER — Other Ambulatory Visit: Payer: Self-pay

## 2021-07-28 ENCOUNTER — Encounter (HOSPITAL_COMMUNITY): Payer: Self-pay

## 2021-07-28 DIAGNOSIS — I10 Essential (primary) hypertension: Secondary | ICD-10-CM | POA: Diagnosis not present

## 2021-07-28 DIAGNOSIS — Z79899 Other long term (current) drug therapy: Secondary | ICD-10-CM | POA: Diagnosis not present

## 2021-07-28 DIAGNOSIS — K529 Noninfective gastroenteritis and colitis, unspecified: Secondary | ICD-10-CM | POA: Diagnosis not present

## 2021-07-28 DIAGNOSIS — Z7982 Long term (current) use of aspirin: Secondary | ICD-10-CM | POA: Insufficient documentation

## 2021-07-28 DIAGNOSIS — Z91040 Latex allergy status: Secondary | ICD-10-CM | POA: Insufficient documentation

## 2021-07-28 DIAGNOSIS — E860 Dehydration: Secondary | ICD-10-CM | POA: Diagnosis not present

## 2021-07-28 DIAGNOSIS — R112 Nausea with vomiting, unspecified: Secondary | ICD-10-CM | POA: Diagnosis present

## 2021-07-28 LAB — CBC WITH DIFFERENTIAL/PLATELET
Abs Immature Granulocytes: 0.03 10*3/uL (ref 0.00–0.07)
Basophils Absolute: 0 10*3/uL (ref 0.0–0.1)
Basophils Relative: 0 %
Eosinophils Absolute: 0 10*3/uL (ref 0.0–0.5)
Eosinophils Relative: 0 %
HCT: 44.1 % (ref 36.0–46.0)
Hemoglobin: 15 g/dL (ref 12.0–15.0)
Immature Granulocytes: 1 %
Lymphocytes Relative: 18 %
Lymphs Abs: 0.8 10*3/uL (ref 0.7–4.0)
MCH: 32.6 pg (ref 26.0–34.0)
MCHC: 34 g/dL (ref 30.0–36.0)
MCV: 95.9 fL (ref 80.0–100.0)
Monocytes Absolute: 0.3 10*3/uL (ref 0.1–1.0)
Monocytes Relative: 7 %
Neutro Abs: 3.4 10*3/uL (ref 1.7–7.7)
Neutrophils Relative %: 74 %
Platelets: 259 10*3/uL (ref 150–400)
RBC: 4.6 MIL/uL (ref 3.87–5.11)
RDW: 13.1 % (ref 11.5–15.5)
WBC: 4.6 10*3/uL (ref 4.0–10.5)
nRBC: 0 % (ref 0.0–0.2)

## 2021-07-28 LAB — COMPREHENSIVE METABOLIC PANEL
ALT: 22 U/L (ref 0–44)
AST: 52 U/L — ABNORMAL HIGH (ref 15–41)
Albumin: 3.9 g/dL (ref 3.5–5.0)
Alkaline Phosphatase: 49 U/L (ref 38–126)
Anion gap: 11 (ref 5–15)
BUN: 16 mg/dL (ref 8–23)
CO2: 21 mmol/L — ABNORMAL LOW (ref 22–32)
Calcium: 8.6 mg/dL — ABNORMAL LOW (ref 8.9–10.3)
Chloride: 101 mmol/L (ref 98–111)
Creatinine, Ser: 1.09 mg/dL — ABNORMAL HIGH (ref 0.44–1.00)
GFR, Estimated: 56 mL/min — ABNORMAL LOW (ref >60)
Glucose, Bld: 113 mg/dL — ABNORMAL HIGH (ref 70–99)
Potassium: 5.3 mmol/L — ABNORMAL HIGH (ref 3.5–5.1)
Sodium: 133 mmol/L — ABNORMAL LOW (ref 135–145)
Total Bilirubin: 2.2 mg/dL — ABNORMAL HIGH (ref 0.3–1.2)
Total Protein: 7.5 g/dL (ref 6.5–8.1)

## 2021-07-28 LAB — LIPASE, BLOOD: Lipase: 24 U/L (ref 11–51)

## 2021-07-28 MED ORDER — SODIUM CHLORIDE 0.9 % IV BOLUS
1000.0000 mL | Freq: Once | INTRAVENOUS | Status: AC
  Administered 2021-07-28: 1000 mL via INTRAVENOUS

## 2021-07-28 MED ORDER — ONDANSETRON 4 MG PO TBDP: ORAL_TABLET | ORAL | 0 refills | Status: DC

## 2021-07-28 NOTE — ED Notes (Signed)
Patient provided with crackers and water for PO challenge.

## 2021-07-28 NOTE — ED Provider Notes (Signed)
[facility] EMERGENCY DEPARTMENT Provider Note   CSN: [id number] Arrival date & time: 07/28/21  0750     History  Chief Complaint  Patient presents with   Nausea   Emesis    Kathy Juarez is a 66 y.o. female.  Patient complains of vomiting and diarrhea for the last couple days.  No vomiting today no blood in her diarrhea.  She has had some abdominal cramps.  Patient has history of hypertension and obesity  The history is provided by the patient.  Emesis Severity:  Moderate Timing:  Intermittent Quality:  Bilious material Able to tolerate:  Solids Progression:  Improving Chronicity:  New Recent urination:  Normal Relieved by:  Nothing Worsened by:  Nothing Ineffective treatments:  None tried Associated symptoms: abdominal pain and diarrhea   Associated symptoms: no cough and no headaches       Home Medications Prior to Admission medications   Medication Sig Start Date End Date Taking? Authorizing Provider  ondansetron (ZOFRAN-ODT) 4 MG disintegrating tablet 4mg  ODT q4 hours prn nausea/vomit 07/28/21  Yes Milton Ferguson, MD  albuterol (VENTOLIN HFA) 108 (90 Base) MCG/ACT inhaler Inhale 1 puff into the lungs every 6 (six) hours as needed for wheezing or shortness of breath. 05/19/21   Lauree Chandler, NP  aspirin 325 MG EC tablet Take 325 mg by mouth daily.    [provider]  azithromycin (ZITHROMAX) 250 MG tablet Take 2 tablets ( 500 mg ) by mouth x 1 dose then 250 mg tablet daily x 4 days. 05/09/21   Ngetich, Dinah C, NP  Cholecalciferol (VITAMIN D3) 125 MCG (5000 UT) CAPS Take by mouth daily.    [provider]  pravastatin (PRAVACHOL) 40 MG tablet Take 1 tablet (40 mg total) by mouth daily. 03/27/21   Glendale Chard, MD  telmisartan-hydrochlorothiazide (MICARDIS HCT) 80-12.5 MG tablet TAKE ONE TABLET BY MOUTH ONE TIME DAILY 07/04/21   Ngetich, Nelda Bucks, NP      Allergies    Fish allergy and Latex    Review of Systems   Review of Systems   Constitutional:  Negative for appetite change and fatigue.  HENT:  Negative for congestion, ear discharge and sinus pressure.   Eyes:  Negative for discharge.  Respiratory:  Negative for cough.   Cardiovascular:  Negative for chest pain.  Gastrointestinal:  Positive for abdominal pain, diarrhea and vomiting.  Genitourinary:  Negative for frequency and hematuria.  Musculoskeletal:  Negative for back pain.  Skin:  Negative for rash.  Neurological:  Negative for seizures and headaches.  Psychiatric/Behavioral:  Negative for hallucinations.    Physical Exam Updated Vital Signs BP 137/81    Pulse 80    Temp 98.9 F (37.2 C)    Resp 16    Ht 5\' 3"  (1.6 m)    Wt 122.5 kg    SpO2 93%    BMI 47.83 kg/m  Physical Exam Vitals and nursing note reviewed.  Constitutional:      Appearance: She is well-developed.  HENT:     Head: Normocephalic.     Mouth/Throat:     Mouth: Mucous membranes are moist.  Eyes:     General: No scleral icterus.    Conjunctiva/sclera: Conjunctivae normal.  Neck:     Thyroid: No thyromegaly.  Cardiovascular:     Rate and Rhythm: Normal rate and regular rhythm.     Heart sounds: No murmur heard.   No friction rub. No gallop.  Pulmonary:  Breath sounds: No stridor. No wheezing or rales.  Chest:     Chest wall: No tenderness.  Abdominal:     General: There is no distension.     Tenderness: There is abdominal tenderness. There is no rebound.  Musculoskeletal:        General: Normal range of motion.     Cervical back: Neck supple.  Lymphadenopathy:     Cervical: No cervical adenopathy.  Skin:    Findings: No erythema or rash.  Neurological:     Mental Status: She is alert and oriented to person, place, and time.     Motor: No abnormal muscle tone.     Coordination: Coordination normal.  Psychiatric:        Behavior: Behavior normal.    ED Results / Procedures / Treatments   Labs (all labs ordered are listed, but only abnormal results are  displayed) Labs Reviewed  COMPREHENSIVE METABOLIC PANEL - Abnormal; Notable for the following components:      Result Value   Sodium 133 (*)    Potassium 5.3 (*)    CO2 21 (*)    Glucose, Bld 113 (*)    Creatinine, Ser 1.09 (*)    Calcium 8.6 (*)    AST 52 (*)    Total Bilirubin 2.2 (*)    GFR, Estimated 56 (*)    All other components within normal limits  CBC WITH DIFFERENTIAL/PLATELET  LIPASE, BLOOD    EKG None  Radiology No results found.  Procedures Procedures    Medications Ordered in ED Medications  sodium chloride 0.9 % bolus 1,000 mL (1,000 mLs Intravenous New Bag/Given 07/28/21 [id number])    ED Course/ Medical Decision Making/ A&P                           Medical Decision Making Amount and/or Complexity of Data Reviewed Labs: ordered.  Risk Prescription drug management.   Patient with dehydration and gastroenteritis.  Patient improved with fluids and Zofran.  Patient had mild elevated T bili.  Possibly related to the viral infection.  She will follow-up with her PCP next week and is given   This patient presents to the ED for concern of vomiting and diarrhea, this involves an extensive number of treatment options, and is a complaint that carries with it a high risk of complications and morbidity.  The differential diagnosis includes gastroenteritis, sepsis   Co morbidities that complicate the patient evaluation  Hypertension obesity  Additional history obtained:  Additional history obtained from patient External records from outside source obtained and reviewed including hospital record   Lab Tests:  I Ordered, and personally interpreted labs.  The pertinent results include: CBC chemistries and liver studies which showed elevated T bili   Imaging Studies ordered:  No imaging  Cardiac Monitoring:  The patient was maintained on a cardiac monitor.  I personally viewed and interpreted the cardiac monitored which showed an underlying rhythm of:  Sinus tach   Medicines ordered and prescription drug management:  I ordered medication including normal saline bolus and Zofran Reevaluation of the patient after these medicines showed that the patient improved I have reviewed the patients home medicines and have made adjustments as needed   Test Considered:  CT abdomen   Critical Interventions:  Normal saline bolus  Consultations Obtained:  No consult  Problem List / ED Course:  Hypertension gastroenteritis   Reevaluation:  After the interventions noted above, I reevaluated the  patient and found that they have :improved   Social Determinants of Health:  None   Dispostion:  After consideration of the diagnostic results and the patients response to treatment, I feel that the patent would benefit from discharge home with Zofran and follow-up with PCP.  Patient also need to address her elevated T bili.         Final Clinical Impression(s) / ED Diagnoses Final diagnoses:  Gastroenteritis  Dehydration    Rx / DC Orders ED Discharge Orders          Ordered    ondansetron (ZOFRAN-ODT) 4 MG disintegrating tablet        07/28/21 1046              Milton Ferguson, MD 07/29/21 0830

## 2021-07-28 NOTE — ED Triage Notes (Signed)
Patient c/o N/V/D since yesterday morning. States she feels like her heart rate is elevated. Denies other symptoms

## 2021-07-28 NOTE — Discharge Instructions (Signed)
Drink plenty of fluids.  Follow-up with your family doctor next week.  He will need to recheck your liver studies to make sure they are getting better

## 2021-08-28 ENCOUNTER — Other Ambulatory Visit: Payer: BC Managed Care – PPO

## 2021-08-31 ENCOUNTER — Ambulatory Visit: Payer: BC Managed Care – PPO | Admitting: Family

## 2021-09-08 ENCOUNTER — Ambulatory Visit: Payer: BC Managed Care – PPO | Admitting: Family

## 2021-09-26 ENCOUNTER — Other Ambulatory Visit: Payer: Self-pay | Admitting: Family

## 2021-10-20 LAB — CBC: RBC: 4.42 (ref 3.87–5.11)

## 2021-10-20 LAB — COMPREHENSIVE METABOLIC PANEL
Albumin: 4 (ref 3.5–5.0)
Calcium: 9.3 (ref 8.7–10.7)

## 2021-10-20 LAB — BASIC METABOLIC PANEL
BUN: 13 (ref 4–21)
CO2: 25 — AB (ref 13–22)
Chloride: 105 (ref 99–108)
Creatinine: 0.9 (ref 0.5–1.1)
Glucose: 115
Potassium: 4.1 mEq/L (ref 3.5–5.1)
Sodium: 139 (ref 137–147)

## 2021-10-20 LAB — LIPID PANEL
Cholesterol: 194 (ref 0–200)
HDL: 42 (ref 35–70)
LDL Cholesterol: 129
Triglycerides: 115 (ref 40–160)

## 2021-10-20 LAB — HEPATIC FUNCTION PANEL
ALT: 15 U/L (ref 7–35)
AST: 14 (ref 13–35)
Alkaline Phosphatase: 57 (ref 25–125)
Bilirubin, Total: 0.7

## 2021-10-20 LAB — CBC AND DIFFERENTIAL
HCT: 42 (ref 36–46)
Hemoglobin: 14.1 (ref 12.0–16.0)
Neutrophils Absolute: 44
Platelets: 270 10*3/uL (ref 150–400)

## 2021-10-20 LAB — HEMOGLOBIN A1C: Hemoglobin A1C: 5.8

## 2021-10-20 LAB — IRON,TIBC AND FERRITIN PANEL
Ferritin: 109
Iron: 116

## 2021-12-28 ENCOUNTER — Encounter: Payer: Self-pay | Admitting: Family

## 2021-12-28 NOTE — Patient Instructions (Signed)
Please contact your local pharmacy, previous provider, or insurance carrier for vaccine/immunization records. Ensure that any procedures done outside of [facility] are faxed to us [phone] or you can sign release of records form at the front desk to keep your medical record updated.   ?

## 2021-12-31 NOTE — Progress Notes (Signed)
  This encounter was created in error - please disregard. No show 

## 2022-01-03 ENCOUNTER — Encounter: Payer: Self-pay | Admitting: Family

## 2022-01-03 ENCOUNTER — Ambulatory Visit (INDEPENDENT_AMBULATORY_CARE_PROVIDER_SITE_OTHER): Payer: Medicare Other | Admitting: Family

## 2022-01-03 VITALS — BP 128/78 | HR 100 | Temp 96.5°F | Resp 18 | Ht 63.0 in | Wt 274.8 lb

## 2022-01-03 DIAGNOSIS — Z780 Asymptomatic menopausal state: Secondary | ICD-10-CM

## 2022-01-03 DIAGNOSIS — Z23 Encounter for immunization: Secondary | ICD-10-CM

## 2022-01-03 DIAGNOSIS — E2839 Other primary ovarian failure: Secondary | ICD-10-CM

## 2022-01-03 DIAGNOSIS — I10 Essential (primary) hypertension: Secondary | ICD-10-CM

## 2022-01-03 DIAGNOSIS — E782 Mixed hyperlipidemia: Secondary | ICD-10-CM

## 2022-01-03 DIAGNOSIS — R7303 Prediabetes: Secondary | ICD-10-CM

## 2022-01-03 DIAGNOSIS — J4520 Mild intermittent asthma, uncomplicated: Secondary | ICD-10-CM

## 2022-01-03 DIAGNOSIS — Z6841 Body Mass Index (BMI) 40.0 and over, adult: Secondary | ICD-10-CM

## 2022-01-03 DIAGNOSIS — Z113 Encounter for screening for infections with a predominantly sexual mode of transmission: Secondary | ICD-10-CM

## 2022-01-03 MED ORDER — TELMISARTAN-HCTZ 80-12.5 MG PO TABS: 1.0000 | ORAL_TABLET | Freq: Every day | ORAL | 1 refills | Status: DC

## 2022-01-03 NOTE — Progress Notes (Signed)
Provider: Richarda Blade FNP-C   Tonnia Bardin, Donalee Citrin, NP  Patient Care Team: Radie Berges, Donalee Citrin, NP as PCP - General (Family Medicine)  Extended Emergency Contact Information Primary Emergency Contact: Roseman,AVERY Mobile Phone: [phone] Relation: Son Secondary Emergency Contact: Lucienne Minks [place] Home Phone: [phone] Mobile Phone: [phone] Relation: Son  Code Status:  Full Code  Goals of care: Advanced Directive information    01/03/2022   10:28 AM  Advanced Directives  Does Patient Have a Medical Advance Directive? No  Would patient like information on creating a medical advance directive? No - Patient declined     Chief Complaint  Patient presents with   Medical Management of Chronic Issues    Routine Visit.   Labs     Fasting Labs   Health Maintenance    Discuss the need for HIV Screening, and Dexa Scan.   Immunizations    Discuss the need for Shingrix vaccine, Pne vaccine, and Covid Booster.    HPI:  Pt is a 66 y.o. female seen today for 97-month follow-up for medical management of chronic diseases.  Has a medical history of essential hypertension, hyperlipidemia, vitamin D deficiency, mild intermittent asthma, morbid obesity among other conditions. She denies any acute issues this visit.    Following up with weight loss clinic.  No weight gain since previous visit  Asthma - uses inhaler once in a while with a cough and wheezing. Latest lab work done 10/20/2021 hemoglobin A1c 5.8 improved compared to previous 6.0 Lipid panel LDL 129 with normal triglycerides and total cholesterol.  Comprehensive panel and CBC unremarkable.  Health maintenance: Due for bone density.Will order bone density made aware imaging place will call her for an appointment. Also due for Shingrix and COVID booster vaccine aware to get vaccines at the pharmacy  Due for pneumococcal vaccine.  Agrees to receive vaccine this visit.   Past Medical History:   Diagnosis Date   Abnormal EKG    Allergic rhinitis    Benign essential hypertension    Depressive disorder    High cholesterol    History of colonoscopy    History of mammogram    Hyperlipidemia    Hypertension    Obesity    Vitamin D deficiency    Past Surgical History:  Procedure Laterality Date   ABDOMINAL HYSTERECTOMY     CESAREAN SECTION     x 3   CHOLECYSTECTOMY      Allergies  Allergen Reactions   Fish Allergy    Flavoring Agent Other (See Comments)   Latex Itching    Allergies as of 01/03/2022       Reactions   Fish Allergy    Flavoring Agent Other (See Comments)   Latex Itching        Medication List        Accurate as of January 03, 2022 11:02 AM. If you have any questions, ask your nurse or doctor.          STOP taking these medications    azithromycin 250 MG tablet Commonly known as: Zithromax Stopped by: Donalee Citrin Amahia Madonia, NP   ondansetron 4 MG disintegrating tablet Commonly known as: ZOFRAN-ODT Stopped by: Caesar Bookman, NP       TAKE these medications    albuterol 108 (90 Base) MCG/ACT inhaler Commonly known as: VENTOLIN HFA Inhale 1 puff into the lungs every 6 (six) hours as needed for wheezing or shortness of breath.   aspirin EC 325 MG tablet  Take 325 mg by mouth daily.   EMERGEN-C IMMUNE PO Take 1 packet by mouth as needed.   GARLIC 1500 PO Take 1 capsule by mouth daily.   pravastatin 40 MG tablet Commonly known as: PRAVACHOL TAKE ONE TABLET BY MOUTH ONE TIME DAILY   telmisartan-hydrochlorothiazide 80-12.5 MG tablet Commonly known as: MICARDIS HCT TAKE ONE TABLET BY MOUTH ONE TIME DAILY   TENUATE PO Take 1 tablet by mouth 2 (two) times daily.   Vitamin D3 125 MCG (5000 UT) Caps Take by mouth daily.        Review of Systems  Constitutional:  Negative for appetite change, chills, fatigue, fever and unexpected weight change.  HENT:  Negative for congestion, dental problem, ear discharge, ear pain, facial  swelling, hearing loss, nosebleeds, postnasal drip, rhinorrhea, sinus pressure, sinus pain, sneezing, sore throat, tinnitus and trouble swallowing.   Eyes:  Negative for pain, discharge, redness, itching and visual disturbance.  Respiratory:  Negative for cough, chest tightness, shortness of breath and wheezing.   Cardiovascular:  Negative for chest pain, palpitations and leg swelling.  Gastrointestinal:  Negative for abdominal distention, abdominal pain, blood in stool, constipation, diarrhea, nausea and vomiting.  Endocrine: Negative for cold intolerance, heat intolerance, polydipsia, polyphagia and polyuria.  Genitourinary:  Negative for difficulty urinating, dysuria, flank pain, frequency and urgency.  Musculoskeletal:  Negative for arthralgias, back pain, gait problem, joint swelling, myalgias, neck pain and neck stiffness.  Skin:  Negative for color change, pallor, rash and wound.  Neurological:  Negative for dizziness, syncope, speech difficulty, weakness, light-headedness, numbness and headaches.  Hematological:  Does not bruise/bleed easily.  Psychiatric/Behavioral:  Negative for agitation, behavioral problems, confusion, hallucinations, self-injury, sleep disturbance and suicidal ideas. The patient is not nervous/anxious.     Immunization History  Administered Date(s) Administered   Influenza Inj Mdck Quad Pf 03/28/2017   Influenza,inj,Quad PF,6+ Mos 03/13/2015, 04/04/2018   Influenza-Unspecified 04/05/2019, 04/01/2021   Moderna Sars-Covid-2 Vaccination 08/14/2019, 09/14/2019, 05/21/2020, 04/26/2021   Pertinent  Health Maintenance Due  Topic Date Due   DEXA SCAN  Never done   INFLUENZA VACCINE  01/30/2022   MAMMOGRAM  05/18/2023   COLONOSCOPY (Pts 45-56yrs Insurance coverage will need to be confirmed)  12/29/2029   PAP SMEAR-Modifier  Discontinued      12/31/2018    8:40 AM 07/08/2019    8:50 AM 04/28/2021    1:40 PM 07/28/2021    8:03 AM 01/03/2022   10:28 AM  Fall Risk   Falls in the past year? 0 0 0  0  Was there an injury with Fall?   0  0  Fall Risk Category Calculator   0  0  Fall Risk Category   Low  Low  Patient Fall Risk Level  Low fall risk Low fall risk Low fall risk Low fall risk  Patient at Risk for Falls Due to   No Fall Risks  No Fall Risks  Fall risk Follow up   Falls evaluation completed  Falls evaluation completed   Functional Status Survey:    Vitals:   01/03/22 1029  BP: 128/78  Pulse: 100  Resp: 18  Temp: (!) 96.5 F (35.8 C)  SpO2: 95%  Weight: 274 lb 12.8 oz (124.6 kg)  Height: 5\' 3"  (1.6 m)   Body mass index is 48.68 kg/m. Physical Exam Vitals reviewed.  Constitutional:      General: She is not in acute distress.    Appearance: Normal appearance. She is morbidly obese. She is  not ill-appearing or diaphoretic.  HENT:     Head: Normocephalic.     Right Ear: Tympanic membrane, ear canal and external ear normal. There is no impacted cerumen.     Left Ear: Tympanic membrane, ear canal and external ear normal. There is no impacted cerumen.     Nose: Nose normal. No congestion or rhinorrhea.     Mouth/Throat:     Mouth: Mucous membranes are moist.     Pharynx: Oropharynx is clear. No oropharyngeal exudate or posterior oropharyngeal erythema.  Eyes:     General: No scleral icterus.       Right eye: No discharge.        Left eye: No discharge.     Extraocular Movements: Extraocular movements intact.     Conjunctiva/sclera: Conjunctivae normal.     Pupils: Pupils are equal, round, and reactive to light.  Neck:     Vascular: No carotid bruit.  Cardiovascular:     Rate and Rhythm: Normal rate and regular rhythm.     Pulses: Normal pulses.     Heart sounds: Normal heart sounds. No murmur heard.    No friction rub. No gallop.  Pulmonary:     Effort: Pulmonary effort is normal. No respiratory distress.     Breath sounds: Normal breath sounds. No wheezing, rhonchi or rales.  Chest:     Chest wall: No tenderness.   Abdominal:     General: Bowel sounds are normal. There is no distension.     Palpations: Abdomen is soft. There is no mass.     Tenderness: There is no abdominal tenderness. There is no right CVA tenderness, left CVA tenderness, guarding or rebound.  Musculoskeletal:        General: No swelling or tenderness. Normal range of motion.     Cervical back: Normal range of motion. No rigidity or tenderness.     Right lower leg: No edema.     Left lower leg: No edema.  Lymphadenopathy:     Cervical: No cervical adenopathy.  Skin:    General: Skin is warm and dry.     Coloration: Skin is not pale.     Findings: No bruising, erythema, lesion or rash.  Neurological:     Mental Status: She is alert and oriented to person, place, and time.     Cranial Nerves: No cranial nerve deficit.     Sensory: No sensory deficit.     Motor: No weakness.     Coordination: Coordination normal.     Gait: Gait normal.  Psychiatric:        Mood and Affect: Mood normal.        Speech: Speech normal.        Behavior: Behavior normal.        Thought Content: Thought content normal.        Judgment: Judgment normal.     Labs reviewed: Recent Labs    05/03/21 0818 07/28/21 0821  NA 139 133*  K 4.0 5.3*  CL 103 101  CO2 25 21*  GLUCOSE 117* 113*  BUN 17 16  CREATININE 0.87 1.09*  CALCIUM 9.4 8.6*   Recent Labs    05/03/21 0818 07/28/21 0821  AST 12 52*  ALT 15 22  ALKPHOS  --  49  BILITOT 0.5 2.2*  PROT 6.8 7.5  ALBUMIN  --  3.9   Recent Labs    05/03/21 0818 07/28/21 0821  WBC 6.3 4.6  NEUTROABS 3,011 3.4  HGB  13.5 15.0  HCT 40.2 44.1  MCV 94.6 95.9  PLT 287 259   Lab Results  Component Value Date   TSH 1.87 05/03/2021   Lab Results  Component Value Date   HGBA1C 6.0 (H) 05/03/2021   Lab Results  Component Value Date   CHOL 164 05/03/2021   HDL 42 (L) 05/03/2021   LDLCALC 102 (H) 05/03/2021   TRIG 100 05/03/2021   CHOLHDL 3.9 05/03/2021    Significant Diagnostic  Results in last 30 days:  No results found.  Assessment/Plan Problem List Items Addressed This Visit       Cardiovascular and Mediastinum   Benign essential hypertension - Primary    - Blood pressure well controlled -Continue on Telmisartan -hydrochlorothiazide and Garlic  -Has morbid obesity with BMI 48.68 dietary modification and exercise at least 3 times per week for 30 minutes recommended.  Continues to follow-up with weight management clinic.       Relevant Medications   telmisartan-hydrochlorothiazide (MICARDIS HCT) 80-12.5 MG tablet   Other Relevant Orders   Lipid panel   TSH   COMPLETE METABOLIC PANEL WITH GFR   CBC with Differential/Platelet     Respiratory   Mild intermittent asthma with allergic rhinitis without complication    Symptoms stable -Continue on albuterol inhaler        Other   Prediabetes    Latest hemoglobin A1c has improved 5.8 previous 6.0 -Continue with dietary modification and exercise -Continue to follow-up with weight management      Relevant Orders   Hemoglobin A1c   Postmenopausal estrogen deficiency    - No recent fall or fractures reported -Bone density ordered this visit -Continue on vitamin D supplement      Relevant Orders   DG Bone Density   Need for pneumococcal vaccination    -Pneumococcal 20 vaccine administered today by CMA no reaction reported.  -Advised to take over-the-counter Tylenol as needed for muscle soreness/ achiness Coban fever chills       Relevant Orders   Pneumococcal conjugate vaccine 20-valent (Prevnar 20) (Completed)   Hyperlipidemia    Latest LDL 102 - Dietary modification and exercise at least 3 times per week for 30 minutes advised. -Continue on pravastatin        Relevant Medications   telmisartan-hydrochlorothiazide (MICARDIS HCT) 80-12.5 MG tablet   Other Relevant Orders   Lipid panel   Class 3 severe obesity with body mass index (BMI) of 45.0 to 49.9 in adult (HCC)    BMI 48.68 with  hypertension comorbidity -Continue dietary modification, exercise and follow-up with weight management clinic      Relevant Medications   Diethylpropion HCl (TENUATE PO)   Body mass index (BMI) of 45.0-49.9 in adult [facility])    Has comorbidity condition hypertension BMI down to 48.68 -Continue dietary modification and exercise -Continue to follow-up with weight management clinic      Relevant Medications   Diethylpropion HCl (TENUATE PO)    Family/ staff Communication: Reviewed plan of care with patient verbalized understanding  Labs/tests ordered:  DG bone density  Next Appointment : Return in about 6 months (around 07/06/2022) for medical mangement of chronic issues., Fasting labs in 6 months prior to visit.   Caesar Bookman, NP

## 2022-01-03 NOTE — Patient Instructions (Signed)
-   Please shingles and COVID-19 booster vaccine at your pharmacy

## 2022-01-04 NOTE — Telephone Encounter (Signed)
All labs were done so we do not have to repeat lab work.will recheck labs on your next visit  Thank you for sending lab work.

## 2022-01-07 DIAGNOSIS — J4520 Mild intermittent asthma, uncomplicated: Secondary | ICD-10-CM | POA: Insufficient documentation

## 2022-01-07 DIAGNOSIS — Z23 Encounter for immunization: Secondary | ICD-10-CM | POA: Insufficient documentation

## 2022-01-07 DIAGNOSIS — Z6841 Body Mass Index (BMI) 40.0 and over, adult: Secondary | ICD-10-CM | POA: Insufficient documentation

## 2022-01-07 DIAGNOSIS — E2839 Other primary ovarian failure: Secondary | ICD-10-CM | POA: Insufficient documentation

## 2022-01-07 DIAGNOSIS — R7303 Prediabetes: Secondary | ICD-10-CM | POA: Insufficient documentation

## 2022-01-07 DIAGNOSIS — Z780 Asymptomatic menopausal state: Secondary | ICD-10-CM | POA: Insufficient documentation

## 2022-01-07 NOTE — Assessment & Plan Note (Addendum)
-   No recent fall or fractures reported -Bone density ordered this visit -Continue on vitamin D supplement

## 2022-01-07 NOTE — Assessment & Plan Note (Signed)
Latest hemoglobin A1c has improved 5.8 previous 6.0 -Continue with dietary modification and exercise -Continue to follow-up with weight management

## 2022-01-07 NOTE — Assessment & Plan Note (Addendum)
Has comorbidity condition hypertension BMI down to 48.68 -Continue dietary modification and exercise -Continue to follow-up with weight management clinic

## 2022-01-07 NOTE — Assessment & Plan Note (Signed)
Symptoms stable -Continue on albuterol inhaler

## 2022-01-07 NOTE — Assessment & Plan Note (Signed)
Latest LDL 102 - Dietary modification and exercise at least 3 times per week for 30 minutes advised. -Continue on pravastatin

## 2022-01-07 NOTE — Assessment & Plan Note (Signed)
BMI 48.68 with hypertension comorbidity -Continue dietary modification, exercise and follow-up with weight management clinic

## 2022-01-07 NOTE — Assessment & Plan Note (Signed)
-   Blood pressure well controlled -Continue on Telmisartan -hydrochlorothiazide and Garlic  -Has morbid obesity with BMI 48.68 dietary modification and exercise at least 3 times per week for 30 minutes recommended.  Continues to follow-up with weight management clinic.

## 2022-01-07 NOTE — Assessment & Plan Note (Signed)
-  Pneumococcal 20 vaccine administered today by CMA no reaction reported.  -Advised to take over-the-counter Tylenol as needed for muscle soreness/ achiness Coban fever chills

## 2022-02-07 NOTE — Telephone Encounter (Signed)
Recommend discussing Wegovy medication with weight management clinic

## 2022-04-18 MED ORDER — PRAVASTATIN SODIUM 40 MG PO TABS: 40.0000 mg | ORAL_TABLET | Freq: Every day | ORAL | 1 refills | Status: DC

## 2022-04-18 MED ORDER — PRAVASTATIN SODIUM 40 MG PO TABS
40.0000 mg | ORAL_TABLET | Freq: Every day | ORAL | 1 refills | Status: DC
Start: 2022-04-18 — End: 2022-09-13

## 2022-04-18 NOTE — Telephone Encounter (Signed)
Message routed to Jessica Eubanks, NP due to PCP Ngetich, Dinah C, NP being out of office. 

## 2022-04-28 ENCOUNTER — Other Ambulatory Visit: Payer: Self-pay | Admitting: Family

## 2022-04-28 DIAGNOSIS — Z1231 Encounter for screening mammogram for malignant neoplasm of breast: Secondary | ICD-10-CM

## 2022-05-15 NOTE — Telephone Encounter (Signed)
Either moderna or Pzeifer vaccine will be appropriate.check with your pharmacy the availability.

## 2022-05-16 NOTE — Telephone Encounter (Signed)
Geri,consult with the pharmacy where you had the previous vaccine.Novavax is no longer available.  You can also get RSV vaccine at the pharmacy.

## 2022-05-16 NOTE — Telephone Encounter (Signed)
Message routed to PCP Ngetich, Donalee Citrin, NP . Please reply to patient through MyChart to avoid double documentation.

## 2022-05-18 ENCOUNTER — Ambulatory Visit
Admission: RE | Admit: 2022-05-18 | Discharge: 2022-05-18 | Disposition: A | Discharge status: Home / Self Care | Payer: Medicare Other | Source: Ambulatory Visit

## 2022-05-18 DIAGNOSIS — Z1231 Encounter for screening mammogram for malignant neoplasm of breast: Secondary | ICD-10-CM

## 2022-07-06 ENCOUNTER — Encounter: Payer: Self-pay | Admitting: Orthopedic Surgery

## 2022-07-06 NOTE — Progress Notes (Unsigned)
Careteam: Patient Care Team: Ngetich, Nelda Bucks, NP as PCP - General (Family Medicine)  Seen by: Windell Moulding, AGNP-C  PLACE OF SERVICE:  [place] Directive information    Allergies  Allergen Reactions   Fish Allergy    Flavoring Agent Other (See Comments)   Latex Itching    Chief Complaint  Patient presents with   Medical Management of Chronic Issues    Patient is here for 6 month follow up   Immunizations    Discussed the need for tdap, Shingles, and covid vaccine   Quality Metric Gaps    Discussed the need for Bone density     HPI: Patient is a 67 y.o. female Review of Systems:  ROS***  Past Medical History:  Diagnosis Date   Abnormal EKG    Allergic rhinitis    Benign essential hypertension    Depressive disorder    High cholesterol    History of colonoscopy    History of mammogram    Hyperlipidemia    Hypertension    Obesity    Vitamin D deficiency    Past Surgical History:  Procedure Laterality Date   ABDOMINAL HYSTERECTOMY     CESAREAN SECTION     x 3   CHOLECYSTECTOMY     Social History:   reports that she has never smoked. She has never used smokeless tobacco. She reports that she does not drink alcohol and does not use drugs.  Family History  Problem Relation Age of Onset   Hypertension Mother    Alcohol abuse Father    Myasthenia gravis Sister    Stroke Maternal Grandmother    Hypertension Maternal Grandmother    Ulcers Maternal Grandmother    Heart attack Maternal Grandfather     Medications: Patient's Medications  New Prescriptions   No medications on file  Previous Medications   ALBUTEROL (VENTOLIN HFA) 108 (90 BASE) MCG/ACT INHALER    Inhale 1 puff into the lungs every 6 (six) hours as needed for wheezing or shortness of breath.   ASPIRIN 325 MG EC TABLET    Take 325 mg by mouth daily.   CHOLECALCIFEROL (VITAMIN D3) 125 MCG (5000 UT) CAPS    Take by mouth daily.   DIETHYLPROPION HCL (TENUATE PO)    Take 1 tablet by  mouth 2 (two) times daily.   GARLIC 4166 PO    Take 1 capsule by mouth daily.   MULTIPLE VITAMINS-MINERALS (EMERGEN-C IMMUNE PO)    Take 1 packet by mouth as needed.   PRAVASTATIN (PRAVACHOL) 40 MG TABLET    Take 1 tablet (40 mg total) by mouth daily.   TELMISARTAN-HYDROCHLOROTHIAZIDE (MICARDIS HCT) 80-12.5 MG TABLET    Take 1 tablet by mouth daily.  Modified Medications   No medications on file  Discontinued Medications   No medications on file    Physical Exam:  There were no vitals filed for this visit. There is no height or weight on file to calculate BMI. Wt Readings from Last 3 Encounters:  01/03/22 274 lb 12.8 oz (124.6 kg)  07/28/21 270 lb (122.5 kg)  04/28/21 274 lb (124.3 kg)    Physical Exam***  Labs reviewed: Basic Metabolic Panel: Recent Labs    07/28/21 0821 10/20/21 0000  NA 133* 139  K 5.3* 4.1  CL 101 105  CO2 21* 25*  GLUCOSE 113*  --   BUN 16 13  CREATININE 1.09* 0.9  CALCIUM 8.6* 9.3   Liver Function Tests: Recent  Labs    07/28/21 0821 10/20/21 0000  AST 52* 14  ALT 22 15  ALKPHOS 49 57  BILITOT 2.2*  --   PROT 7.5  --   ALBUMIN 3.9 4.0   Recent Labs    07/28/21 0821  LIPASE 24   No results for input(s): "AMMONIA" in the last 8760 hours. CBC: Recent Labs    07/28/21 0821 10/20/21 0000  WBC 4.6  --   NEUTROABS 3.4 44.00  HGB 15.0 14.1  HCT 44.1 42  MCV 95.9  --   PLT 259 270   Lipid Panel: Recent Labs    10/20/21 0000  CHOL 194  HDL 42  LDLCALC 129  TRIG 115   TSH: No results for input(s): "TSH" in the last 8760 hours. A1C: Lab Results  Component Value Date   HGBA1C 5.8 10/20/2021     Assessment/Plan There are no diagnoses linked to this encounter.  Next appt: *** Zane Samson [place], [facility] [phone]

## 2022-07-09 NOTE — Progress Notes (Signed)
This encounter was created in error - please disregard.

## 2022-07-12 ENCOUNTER — Other Ambulatory Visit: Payer: Self-pay | Admitting: Family

## 2022-07-12 DIAGNOSIS — I10 Essential (primary) hypertension: Secondary | ICD-10-CM

## 2022-08-21 ENCOUNTER — Ambulatory Visit: Payer: Medicare Other | Admitting: Family

## 2022-08-22 ENCOUNTER — Ambulatory Visit: Payer: Medicare Other | Admitting: Family

## 2022-09-03 ENCOUNTER — Encounter: Payer: Self-pay | Admitting: Family

## 2022-09-03 ENCOUNTER — Ambulatory Visit: Payer: Medicare Other | Admitting: Family

## 2022-09-03 VITALS — BP 146/96 | HR 83 | Temp 98.0°F | Ht 63.0 in | Wt 276.2 lb

## 2022-09-03 DIAGNOSIS — J4520 Mild intermittent asthma, uncomplicated: Secondary | ICD-10-CM

## 2022-09-03 DIAGNOSIS — I10 Essential (primary) hypertension: Secondary | ICD-10-CM | POA: Diagnosis not present

## 2022-09-03 DIAGNOSIS — Z111 Encounter for screening for respiratory tuberculosis: Secondary | ICD-10-CM

## 2022-09-03 DIAGNOSIS — M25551 Pain in right hip: Secondary | ICD-10-CM | POA: Diagnosis not present

## 2022-09-03 DIAGNOSIS — G8929 Other chronic pain: Secondary | ICD-10-CM

## 2022-09-03 DIAGNOSIS — E782 Mixed hyperlipidemia: Secondary | ICD-10-CM

## 2022-09-03 DIAGNOSIS — R7303 Prediabetes: Secondary | ICD-10-CM

## 2022-09-03 NOTE — Patient Instructions (Signed)
-   Please get right hip X-ray at [facility] at [address] then will call you with results.  

## 2022-09-03 NOTE — Progress Notes (Unsigned)
Provider: Marlowe Sax FNP-C   Deitrich Steve, Nelda Bucks, NP  Patient Care Team: Armstrong Creasy, Nelda Bucks, NP as PCP - General (Family Medicine)  Extended Emergency Contact Information Primary Emergency Contact: [place] Phone: [phone] Relation: Son Secondary Emergency Contact: Margaretann Loveless [place] Phone: [phone] Mobile Phone: [phone] Relation: Son  Code Status:  Full Code  Goals of care: Advanced Directive information    09/03/2022    9:35 AM  Advanced Directives  Does Patient Have a Medical Advance Directive? No  Would patient like information on creating a medical advance directive? No - Patient declined     Chief Complaint  Patient presents with   Medical Management of Chronic Issues    Medical Management of Chronic Issues. Blood Pressure is up, patient has not taken her blood pressure medication yesterday or today.   Immunizations    To discuss need for Tdap,Shringrix and Covid or postpone if refuses.     HPI:  Pt is a 67 y.o. female seen today for 6 months follow up for medical management of chronic diseases.   Asthma - has been using albuterol more often states no wheezing but feels like cannot catch her breath at times.  Hypertension - no home B/p readings for evaluation.she states did not take her meds yesterday and this morning.she denies any headache,dizziness,vision changes,fatigue,chest tightness,palpitation,chest pain or shortness of breath.     Due  for Tdap  and shingles vaccine.  Past Medical History:  Diagnosis Date   Abnormal EKG    Allergic rhinitis    Benign essential hypertension    Depressive disorder    High cholesterol    History of colonoscopy    History of mammogram    Hyperlipidemia    Hypertension    Obesity    Vitamin D deficiency    Past Surgical History:  Procedure Laterality Date   ABDOMINAL HYSTERECTOMY     CESAREAN SECTION     x 3   CHOLECYSTECTOMY      Allergies  Allergen  Reactions   Fish Allergy    Flavoring Agent Other (See Comments)   Latex Itching    Allergies as of 09/03/2022       Reactions   Fish Allergy    Flavoring Agent Other (See Comments)   Latex Itching        Medication List        Accurate as of September 03, 2022 10:05 AM. If you have any questions, ask your nurse or doctor.          STOP taking these medications    TENUATE PO Stopped by: Sandrea Hughs, NP       TAKE these medications    albuterol 108 (90 Base) MCG/ACT inhaler Commonly known as: VENTOLIN HFA Inhale 1 puff into the lungs every 6 (six) hours as needed for wheezing or shortness of breath.   aspirin EC 325 MG tablet Take 325 mg by mouth daily.   EMERGEN-C IMMUNE PO Take 1 packet by mouth as needed.   GARLIC 8841 PO Take 1 capsule by mouth daily.   pravastatin 40 MG tablet Commonly known as: PRAVACHOL Take 1 tablet (40 mg total) by mouth daily.   telmisartan-hydrochlorothiazide 80-12.5 MG tablet Commonly known as: MICARDIS HCT TAKE ONE TABLET BY MOUTH ONE TIME DAILY   Vitamin D3 125 MCG (5000 UT) capsule Generic drug: Cholecalciferol Take by mouth daily.        Review of Systems  Constitutional:  Negative  for appetite change, chills, fatigue, fever and unexpected weight change.  HENT:  Negative for congestion, dental problem, ear discharge, ear pain, facial swelling, hearing loss, nosebleeds, postnasal drip, rhinorrhea, sinus pressure, sinus pain, sneezing, sore throat, tinnitus and trouble swallowing.   Eyes:  Negative for pain, discharge, redness, itching and visual disturbance.  Respiratory:  Negative for cough, chest tightness, shortness of breath and wheezing.   Cardiovascular:  Negative for chest pain, palpitations and leg swelling.  Gastrointestinal:  Negative for abdominal distention, abdominal pain, blood in stool, constipation, diarrhea, nausea and vomiting.  Endocrine: Negative for cold intolerance, heat intolerance, polydipsia,  polyphagia and polyuria.  Genitourinary:  Negative for difficulty urinating, dysuria, flank pain, frequency and urgency.  Musculoskeletal:  Positive for arthralgias and gait problem. Negative for back pain, joint swelling, myalgias, neck pain and neck stiffness.       Right hip/thigh pain   Skin:  Negative for color change, pallor, rash and wound.  Neurological:  Negative for dizziness, syncope, speech difficulty, weakness, light-headedness, numbness and headaches.  Hematological:  Does not bruise/bleed easily.  Psychiatric/Behavioral:  Negative for agitation, behavioral problems, confusion, hallucinations, self-injury, sleep disturbance and suicidal ideas. The patient is not nervous/anxious.     Immunization History  Administered Date(s) Administered   Influenza Inj Mdck Quad Pf 03/28/2017   Influenza,inj,Quad PF,6+ Mos 03/13/2015, 04/04/2018   Influenza-Unspecified 04/05/2019, 04/01/2021, 04/09/2022   Moderna Sars-Covid-2 Vaccination 08/14/2019, 09/14/2019, 05/21/2020, 04/26/2021   PNEUMOCOCCAL CONJUGATE-20 01/03/2022   Pertinent  Health Maintenance Due  Topic Date Due   DEXA SCAN  Never done   MAMMOGRAM  05/18/2024   COLONOSCOPY (Pts 45-14yrs Insurance coverage will need to be confirmed)  12/29/2029   INFLUENZA VACCINE  Completed      07/08/2019    8:50 AM 04/28/2021    1:40 PM 07/28/2021    8:03 AM 01/03/2022   10:28 AM 09/03/2022    9:38 AM  Fall Risk  Falls in the past year? 0 0  0   Was there an injury with Fall?  0  0 1  Fall Risk Category Calculator  0  0   Fall Risk Category (Retired)  Low  Low   (RETIRED) Patient Fall Risk Level Low fall risk Low fall risk Low fall risk Low fall risk   Patient at Risk for Falls Due to  No Fall Risks  No Fall Risks   Fall risk Follow up  Falls evaluation completed  Falls evaluation completed    Functional Status Survey:    Vitals:   09/03/22 0928 09/03/22 0939  BP: (!) 146/98 (!) 146/96  Pulse: 83   Temp: 98 F (36.7 C)   SpO2:  96%   Weight: 276 lb 3.2 oz (125.3 kg)   Height: 5\' 3"  (1.6 m)    Body mass index is 48.93 kg/m. Physical Exam Vitals reviewed.  Constitutional:      General: She is not in acute distress.    Appearance: Normal appearance. She is normal weight. She is not ill-appearing or diaphoretic.  HENT:     Head: Normocephalic.     Right Ear: Tympanic membrane, ear canal and external ear normal. There is no impacted cerumen.     Left Ear: Tympanic membrane, ear canal and external ear normal. There is no impacted cerumen.     Nose: Nose normal. No congestion or rhinorrhea.     Mouth/Throat:     Mouth: Mucous membranes are moist.     Pharynx: Oropharynx is clear. No oropharyngeal  exudate or posterior oropharyngeal erythema.  Eyes:     General: No scleral icterus.       Right eye: No discharge.        Left eye: No discharge.     Extraocular Movements: Extraocular movements intact.     Conjunctiva/sclera: Conjunctivae normal.     Pupils: Pupils are equal, round, and reactive to light.  Neck:     Vascular: No carotid bruit.  Cardiovascular:     Rate and Rhythm: Normal rate and regular rhythm.     Pulses: Normal pulses.     Heart sounds: Normal heart sounds. No murmur heard.    No friction rub. No gallop.  Pulmonary:     Effort: Pulmonary effort is normal. No respiratory distress.     Breath sounds: Normal breath sounds. No wheezing, rhonchi or rales.  Chest:     Chest wall: No tenderness.  Abdominal:     General: Bowel sounds are normal. There is no distension.     Palpations: Abdomen is soft. There is no mass.     Tenderness: There is no abdominal tenderness. There is no right CVA tenderness, left CVA tenderness, guarding or rebound.  Musculoskeletal:        General: No swelling or tenderness. Normal range of motion.     Cervical back: Normal range of motion. No rigidity or tenderness.     Right lower leg: No edema.     Left lower leg: No edema.  Lymphadenopathy:     Cervical: No  cervical adenopathy.  Skin:    General: Skin is warm and dry.     Coloration: Skin is not pale.     Findings: No bruising, erythema, lesion or rash.  Neurological:     Mental Status: She is alert and oriented to person, place, and time.     Cranial Nerves: No cranial nerve deficit.     Sensory: No sensory deficit.     Motor: No weakness.     Coordination: Coordination normal.     Gait: Gait normal.  Psychiatric:        Mood and Affect: Mood normal.        Speech: Speech normal.        Behavior: Behavior normal.        Thought Content: Thought content normal.        Judgment: Judgment normal.    Labs reviewed: Recent Labs    10/20/21 0000  NA 139  K 4.1  CL 105  CO2 25*  BUN 13  CREATININE 0.9  CALCIUM 9.3   Recent Labs    10/20/21 0000  AST 14  ALT 15  ALKPHOS 57  ALBUMIN 4.0   Recent Labs    10/20/21 0000  NEUTROABS 44.00  HGB 14.1  HCT 42  PLT 270   Lab Results  Component Value Date   TSH 1.87 05/03/2021   Lab Results  Component Value Date   HGBA1C 5.8 10/20/2021   Lab Results  Component Value Date   CHOL 194 10/20/2021   HDL 42 10/20/2021   LDLCALC 129 10/20/2021   TRIG 115 10/20/2021   CHOLHDL 3.9 05/03/2021    Significant Diagnostic Results in last 30 days:  No results found.  Assessment/Plan  1. Benign essential hypertension B/p not at goal  - continue on Telmisartan - Hydrochlorothiazide  - CBC with Differential/Platelet - COMPLETE METABOLIC PANEL WITH GFR - TSH - Lipid panel  2. Mixed hyperlipidemia LDL at goal  - continue  on pravastatin  - dietary modification and exercise advised  - Lipid panel  3. Mild intermittent asthma with allergic rhinitis without complication Bilateral lungs clear  - continue on Albuterol   4. Chronic right hip pain Right hip worsening  - will obtain imaging  - DG HIP UNILAT W OR W/O PELVIS 2-3 VIEWS RIGHT; Future - DG HIP UNILAT W OR W/O PELVIS 2-3 VIEWS RIGHT  5. Prediabetes Lab Results   Component Value Date   HGBA1C 5.8 10/20/2021  Dietary modification and exercise advised   - Hemoglobin A1c  6. Screening-pulmonary TB Asymptomatic  - QuantiFERON-TB Gold Plus - Hemoglobin A1c Family/ staff Communication: Reviewed plan of care with patient verbalized understanding   Labs/tests ordered:  - QuantiFERON-TB Gold Plus - Hemoglobin A1c - Lipid panel - CBC with Differential/Platelet - COMPLETE METABOLIC PANEL WITH GFR - TSH - Lipid panel - DG HIP UNILAT W OR W/O PELVIS 2-3 VIEWS RIGHT; Future  Next Appointment : Return in about 6 months (around 03/06/2023) for medical mangement of chronic issues.Sandrea Hughs, NP

## 2022-09-05 LAB — LIPID PANEL
Cholesterol: 190 mg/dL (ref <200)
HDL: 49 mg/dL — ABNORMAL LOW (ref >=50)
LDL Cholesterol (Calc): 122 mg/dL (calc) — ABNORMAL HIGH
Non-HDL Cholesterol (Calc): 141 mg/dL (calc) — ABNORMAL HIGH (ref <130)
Total CHOL/HDL Ratio: 3.9 (calc) (ref <5.0)
Triglycerides: 89 mg/dL (ref <150)

## 2022-09-05 LAB — HEMOGLOBIN A1C
Hgb A1c MFr Bld: 6.3 % of total Hgb — ABNORMAL HIGH (ref <5.7)
Mean Plasma Glucose: 134 mg/dL
eAG (mmol/L): 7.4 mmol/L

## 2022-09-05 LAB — CBC WITH DIFFERENTIAL/PLATELET
Absolute Monocytes: 320 cells/uL (ref 200–950)
Basophils Absolute: 50 cells/uL (ref 0–200)
Basophils Relative: 1.1 %
Eosinophils Absolute: 140 cells/uL (ref 15–500)
Eosinophils Relative: 3.1 %
HCT: 41.6 % (ref 35.0–45.0)
Hemoglobin: 14.1 g/dL (ref 11.7–15.5)
Lymphs Abs: 1881 cells/uL (ref 850–3900)
MCH: 31.8 pg (ref 27.0–33.0)
MCHC: 33.9 g/dL (ref 32.0–36.0)
MCV: 93.7 fL (ref 80.0–100.0)
MPV: 9.8 fL (ref 7.5–12.5)
Monocytes Relative: 7.1 %
Neutro Abs: 2111 cells/uL (ref 1500–7800)
Neutrophils Relative %: 46.9 %
Platelets: 258 10*3/uL (ref 140–400)
RBC: 4.44 10*6/uL (ref 3.80–5.10)
RDW: 12.3 % (ref 11.0–15.0)
Total Lymphocyte: 41.8 %
WBC: 4.5 10*3/uL (ref 3.8–10.8)

## 2022-09-05 LAB — COMPLETE METABOLIC PANEL WITH GFR
AG Ratio: 1.3 (calc) (ref 1.0–2.5)
ALT: 9 U/L (ref 6–29)
AST: 9 U/L — ABNORMAL LOW (ref 10–35)
Albumin: 3.8 g/dL (ref 3.6–5.1)
Alkaline phosphatase (APISO): 46 U/L (ref 37–153)
BUN: 16 mg/dL (ref 7–25)
CO2: 28 mmol/L (ref 20–32)
Calcium: 9.4 mg/dL (ref 8.6–10.4)
Chloride: 107 mmol/L (ref 98–110)
Creat: 0.85 mg/dL (ref 0.50–1.05)
Globulin: 3 g/dL (calc) (ref 1.9–3.7)
Glucose, Bld: 100 mg/dL — ABNORMAL HIGH (ref 65–99)
Potassium: 4.4 mmol/L (ref 3.5–5.3)
Sodium: 142 mmol/L (ref 135–146)
Total Bilirubin: 0.4 mg/dL (ref 0.2–1.2)
Total Protein: 6.8 g/dL (ref 6.1–8.1)
eGFR: 76 mL/min/{1.73_m2} (ref >=60)

## 2022-09-05 LAB — QUANTIFERON-TB GOLD PLUS
Mitogen-NIL: 9.16 IU/mL
NIL: 0.05 IU/mL
QuantiFERON-TB Gold Plus: NEGATIVE
TB1-NIL: 0.23 IU/mL
TB2-NIL: 0.18 IU/mL

## 2022-09-05 LAB — TSH: TSH: 1.77 mIU/L (ref 0.40–4.50)

## 2022-09-13 ENCOUNTER — Other Ambulatory Visit: Payer: Self-pay

## 2022-09-13 DIAGNOSIS — E782 Mixed hyperlipidemia: Secondary | ICD-10-CM

## 2022-09-13 DIAGNOSIS — Z79899 Other long term (current) drug therapy: Secondary | ICD-10-CM

## 2022-09-13 MED ORDER — PRAVASTATIN SODIUM 80 MG PO TABS: 80.0000 mg | ORAL_TABLET | Freq: Every day | ORAL | 1 refills | Status: DC

## 2022-09-18 ENCOUNTER — Encounter: Payer: Self-pay | Admitting: Family

## 2022-09-18 ENCOUNTER — Encounter: Payer: Medicare Other | Admitting: Family

## 2022-09-18 ENCOUNTER — Telehealth (INDEPENDENT_AMBULATORY_CARE_PROVIDER_SITE_OTHER): Payer: Medicare Other | Admitting: Family

## 2022-09-18 DIAGNOSIS — Z0000 Encounter for general adult medical examination without abnormal findings: Secondary | ICD-10-CM | POA: Diagnosis not present

## 2022-09-18 NOTE — Progress Notes (Addendum)
Subjective:   Kathy Juarez is a 67 y.o. female who presents for Medicare Annual (Subsequent) preventive examination.  Review of Systems     Cardiac Risk Factors include: advanced age (>52men, >5 women);hypertension;diabetes mellitus;smoking/ tobacco exposure     Objective:    There were no vitals filed for this visit. There is no height or weight on file to calculate BMI.     09/18/2022    1:49 PM 09/03/2022    9:35 AM 01/03/2022   10:28 AM 07/28/2021    8:03 AM 04/28/2021    1:40 PM  Advanced Directives  Does Patient Have a Medical Advance Directive? No No No No No  Would patient like information on creating a medical advance directive? No - Patient declined No - Patient declined No - Patient declined  No - Patient declined    Current Medications (verified) Outpatient Encounter Medications as of 09/18/2022  Medication Sig   albuterol (VENTOLIN HFA) 108 (90 Base) MCG/ACT inhaler Inhale 1 puff into the lungs every 6 (six) hours as needed for wheezing or shortness of breath.   aspirin 325 MG EC tablet Take 325 mg by mouth daily.   Coenzyme Q10 (CO Q 10) 100 MG CAPS    Cyanocobalamin (VITAMIN B-12) 500 MCG LOZG    Diethylpropion HCl 25 MG TABS Take 25 mg by mouth in the morning and at bedtime.   Flaxseed, Linseed, (FLAXSEED OIL) 1400 MG CAPS    metFORMIN (GLUCOPHAGE) 500 MG tablet Take 500 mg by mouth 2 (two) times daily with a meal.   Multiple Vitamins-Minerals (EMERGEN-C IMMUNE PO) Take 1 packet by mouth as needed.   NON FORMULARY Breakfast Burners, after lunch burners   pravastatin (PRAVACHOL) 80 MG tablet Take 1 tablet (80 mg total) by mouth daily. E78.2   telmisartan-hydrochlorothiazide (MICARDIS HCT) 80-12.5 MG tablet TAKE ONE TABLET BY MOUTH ONE TIME DAILY   [DISCONTINUED] Cholecalciferol (VITAMIN D3) 125 MCG (5000 UT) CAPS Take by mouth daily.   [DISCONTINUED] GARLIC 99991111 PO Take 1 capsule by mouth daily.   No facility-administered encounter medications on file as of  09/18/2022.    Allergies (verified) Fish allergy and Latex   History: Past Medical History:  Diagnosis Date   Abnormal EKG    Allergic rhinitis    Benign essential hypertension    Depressive disorder    High cholesterol    History of colonoscopy    History of mammogram    Hyperlipidemia    Hypertension    Obesity    Vitamin D deficiency    Past Surgical History:  Procedure Laterality Date   ABDOMINAL HYSTERECTOMY     CESAREAN SECTION     x 3   CHOLECYSTECTOMY     Family History  Problem Relation Age of Onset   Hypertension Mother    Alcohol abuse Father    Myasthenia gravis Sister    Stroke Maternal Grandmother    Hypertension Maternal Grandmother    Ulcers Maternal Grandmother    Heart attack Maternal Grandfather    Social History   Socioeconomic History   Marital status: Divorced    Spouse name: Not on file   Number of children: Not on file   Years of education: Not on file   Highest education level: Not on file  Occupational History   Not on file  Tobacco Use   Smoking status: Never   Smokeless tobacco: Never  Vaping Use   Vaping Use: Never used  Substance and Sexual Activity  Alcohol use: No   Drug use: No   Sexual activity: Not on file  Other Topics Concern   Not on file  Social History Narrative   Tobacco use, amount per day now: None   Past tobacco use, amount per day: None   How many years did you use tobacco: N/A   Alcohol use (drinks per week): N/A   Diet:   Do you drink/eat things with caffeine: yes on occasion.   Marital status:    Divorced                              What year were you married? 1982   Do you live in a house, apartment, assisted living, condo, trailer, etc.? House   Is it one or more stories? One Story.   How many persons live in your home? Just myself.   Do you have pets in your home?( please list) No ( dog died just two weeks ago )   Highest Level of education completed? Masters Degree.   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    Do you exercise?  Very little at present                                Type and how often?   Do you have a living will? No   Do you have a DNR form?  No                                  If not, do you want to discuss one?   Do you have signed POA/HPOA forms? No                       If so, please bring to you appointment      Do you have any difficulty bathing or dressing yourself? No   Do you have any difficulty preparing food or eating? No   Do you have any difficulty managing your medications? No   Do you have any difficulty managing your finances? No   Do you have any difficulty affording your medications? No   Social Determinants of Health   Financial Resource Strain: Low Risk  (09/18/2022)   Overall Financial Resource Strain (CARDIA)    Difficulty of Paying Living Expenses: Not hard at all  Food Insecurity: No Food Insecurity (09/18/2022)   Hunger Vital Sign    Worried About Running Out of Food in the Last Year: Never true    Ran Out of Food in the Last Year: Never true  Transportation Needs: No Transportation Needs (09/18/2022)   PRAPARE - Hydrologist (Medical): No    Lack of Transportation (Non-Medical): No  Physical Activity: Insufficiently Active (09/18/2022)   Exercise Vital Sign    Days of Exercise per Week: 2 days    Minutes of Exercise per Session: 20 min  Stress: No Stress Concern Present (09/18/2022)   [place]    Feeling of Stress : Not at all  Social Connections: Socially Isolated (09/18/2022)   Social Connection and Isolation Panel [NHANES]    Frequency of Communication with Friends and Family: More than three times a week    Frequency of  Social Gatherings with Friends and Family: Twice a week    Attends Religious Services: Never    Marine scientist or Organizations: No    Attends Arts administrator: Never    Marital Status: Divorced    Tobacco Counseling Counseling given: Not Answered   Clinical Intake:  Pre-visit preparation completed: No  Pain : 0-10 Pain Type: Chronic pain Pain Location: Leg Pain Orientation: Right Pain Radiating Towards: yes Pain Descriptors / Indicators: Aching Pain Onset: More than a month ago Pain Frequency: Intermittent Pain Relieving Factors: analgesic Effect of Pain on Daily Activities: sitting  Pain Relieving Factors: analgesic  BMI - recorded: 48.93  How often do you need to have someone help you when you read instructions, pamphlets, or other written materials from your doctor or pharmacy?: 1 - Never What is the last grade level you completed in school?: Master degree  Diabetic?Yes   Interpreter Needed?: No      Activities of Daily Living    09/18/2022    3:09 PM 09/18/2022    1:50 PM  In your present state of health, do you have any difficulty performing the following activities:  Hearing? 0 0  Vision? 0 0  Difficulty concentrating or making decisions? 0 0  Walking or climbing stairs? 0 0  Dressing or bathing? 0 0  Doing errands, shopping? 0 0  Preparing Food and eating ? N N  Using the Toilet? N N  In the past six months, have you accidently leaked urine? N N  Do you have problems with loss of bowel control? N N  Managing your Medications? N N  Managing your Finances? N N  Housekeeping or managing your Housekeeping? N N    Patient Care Team: Stclair Szymborski, Nelda Bucks, NP as PCP - General (Family Medicine)  Indicate any recent Medical Services you may have received from other than [facility] providers in the past year (date may be approximate).     Assessment:   This is a routine wellness examination for Kathy Juarez.  Hearing/Vision screen No results found.  Dietary issues and exercise activities discussed: Current Exercise Habits: The patient does not participate in regular exercise at present, Exercise limited by: None  identified   Goals Addressed             This Visit's Progress    Patient Stated       Loss weight        Depression Screen    09/18/2022    1:50 PM 09/03/2022    9:38 AM 12/31/2018    8:40 AM 12/29/2018    2:09 PM  PHQ 2/9 Scores  PHQ - 2 Score 0 0 0 0    Fall Risk    09/18/2022    1:49 PM 09/03/2022    9:38 AM 01/03/2022   10:28 AM 04/28/2021    1:40 PM 07/08/2019    8:50 AM  [place] in the past year? 0  0 0 0  Number falls in past yr: 0 0 0 0   Injury with Fall? 0 1 0 0   Risk for fall due to : No Fall Risks  No Fall Risks No Fall Risks   Follow up   Falls evaluation completed Falls evaluation completed     [place]:  Any stairs in or around the home? No  If so, are there any without handrails? No  Home free of loose throw rugs in  walkways, pet beds, electrical cords, etc? No  Adequate lighting in your home to reduce risk of falls? Yes   ASSISTIVE DEVICES UTILIZED TO PREVENT FALLS:  Life alert? No  Use of a cane, walker or w/c? No  Grab bars in the bathroom? No  Shower chair or bench in shower? No  Elevated toilet seat or a handicapped toilet? No   TIMED UP AND GO:  Was the test performed? No .  Length of time to ambulate 10 feet: N/A sec.   Gait slow and steady without use of assistive device  Cognitive Function:        09/18/2022    1:50 PM  6CIT Screen  What Year? 0 points  What month? 0 points  What time? 0 points  Count back from 20 0 points  Months in reverse 0 points  Repeat phrase 0 points  Total Score 0 points    Immunizations Immunization History  Administered Date(s) Administered   Influenza Inj Mdck Quad Pf 03/28/2017   Influenza,inj,Quad PF,6+ Mos 03/13/2015, 04/04/2018   Influenza-Unspecified 04/05/2019, 04/01/2021, 04/09/2022   Moderna Sars-Covid-2 Vaccination 08/14/2019, 09/14/2019, 05/21/2020, 04/26/2021   PNEUMOCOCCAL CONJUGATE-20 01/03/2022    TDAP status: Up to date  Flu  Vaccine status: Due, Education has been provided regarding the importance of this vaccine. Advised may receive this vaccine at local pharmacy or Health Dept. Aware to provide a copy of the vaccination record if obtained from local pharmacy or Health Dept. Verbalized acceptance and understanding.  Pneumococcal vaccine status: Up to date  Covid-19 vaccine status: Information provided on how to obtain vaccines.   Qualifies for Shingles Vaccine? Yes   Zostavax completed No   Shingrix Completed?: No.    Education has been provided regarding the importance of this vaccine. Patient has been advised to call insurance company to determine out of pocket expense if they have not yet received this vaccine. Advised may also receive vaccine at local pharmacy or Health Dept. Verbalized acceptance and understanding.  Screening Tests Health Maintenance  Topic Date Due   DTaP/Tdap/Td (1 - Tdap) Never done   DEXA SCAN  Never done   COVID-19 Vaccine (5 - 2023-24 season) 10/04/2022 (Originally 03/02/2022)   Zoster Vaccines- Shingrix (1 of 2) 12/19/2022 (Originally 06/06/1975)   Medicare Annual Wellness ([place])  09/18/2023   MAMMOGRAM  05/18/2024   COLONOSCOPY (Pts 45-77yrs Insurance coverage will need to be confirmed)  12/29/2029   Pneumonia Vaccine 68+ Years old  Completed   INFLUENZA VACCINE  Completed   Hepatitis C Screening  Completed   HPV VACCINES  Aged Out    Health Maintenance  Health Maintenance Due  Topic Date Due   DTaP/Tdap/Td (1 - Tdap) Never done   DEXA SCAN  Never done    10  Mammogram status: Completed 05/18/2022. Repeat every year  Bone Density status: Ordered 01/03/2022. Pt provided with contact info and advised to call to schedule appt.  Lung Cancer Screening: (Low Dose CT Chest recommended if Age 31-80 years, 30 pack-year currently smoking OR have quit w/in 15years.) does not qualify.   Lung Cancer Screening Referral: No   Additional Screening:  Hepatitis C Screening: does  qualify; Completed yes   Vision Screening: Recommended annual ophthalmology exams for early detection of glaucoma and other disorders of the eye. Is the patient up to date with their annual eye exam?  No  Who is the provider or what is the name of the office in which the patient attends annual eye exams? Need  referral  If pt is not established with a provider, would they like to be referred to a provider to establish care? Yes .   Dental Screening: Recommended annual dental exams for proper oral hygiene  Community Resource Referral / Chronic Care Management: CRR required this visit?  No   CCM required this visit?  No      Plan:     I have personally reviewed and noted the following in the patient's chart:   Medical and social history Use of alcohol, tobacco or illicit drugs  Current medications and supplements including opioid prescriptions. Patient is not currently taking opioid prescriptions. Functional ability and status Nutritional status Physical activity Advanced directives List of other physicians Hospitalizations, surgeries, and ER visits in previous 12 months Vitals Screenings to include cognitive, depression, and falls Referrals and appointments  In addition, I have reviewed and discussed with patient certain preventive protocols, quality metrics, and best practice recommendations. A written personalized care plan for preventive services as well as general preventive health recommendations were provided to patient.  Caesar Bookman, NP   09/18/2022   Nurse Notes: advised to get shingles and Tdap vaccine at the pharmacy.she will call to schedule for bone density.   This service is provided via telemedicine  No vital signs collected/recorded due to the encounter was a telemedicine visit.   Location of patient (ex: home, work):  Home   Patient consents to a telephone visit:  yes  Location of the provider (ex: office, home):  [facility] office   Name of any referring  provider:  Kyani Simkin,FNP- C  I connected with Kathy Juarez on 09/18/2022 by a video enabled telemedicine application and verified that I am speaking with the correct person using two identifiers.   I discussed the limitations of evaluation and management by telemedicine. The patient expressed understanding and agreed to proceed.

## 2022-09-24 NOTE — Patient Instructions (Addendum)
Kathy Juarez , Thank you for taking time to come for your Medicare Wellness Visit. I appreciate your ongoing commitment to your health goals. Please review the following plan we discussed and let me know if I can assist you in the future.   Screening recommendations/referrals: Colonoscopy : Up to date  Mammogram: : Up to date  Bone Density : Up to date  Recommended yearly ophthalmology/optometry visit for glaucoma screening and checkup Recommended yearly dental visit for hygiene and checkup  Vaccinations: Influenza vaccine- due annually in September/October Pneumococcal vaccine : Up to date  Tdap vaccine : Up to date  Shingles vaccine : due please get vaccine at the pharmacy     Advanced directives: No   Conditions/risks identified: advanced age (>16men, >83 women);hypertension;diabetes mellitus;smoking/ tobacco exposure  Next appointment: 1 year    Preventive Care 101 Years and Older, Female Preventive care refers to lifestyle choices and visits with your health care provider that can promote health and wellness. What does preventive care include? A yearly physical exam. This is also called an annual well check. Dental exams once or twice a year. Routine eye exams. Ask your health care provider how often you should have your eyes checked. Personal lifestyle choices, including: Daily care of your teeth and gums. Regular physical activity. Eating a healthy diet. Avoiding tobacco and drug use. Limiting alcohol use. Practicing safe sex. Taking low-dose aspirin every day. Taking vitamin and mineral supplements as recommended by your health care provider. What happens during an annual well check? The services and screenings done by your health care provider during your annual well check will depend on your age, overall health, lifestyle risk factors, and family history of disease. Counseling  Your health care provider may ask you questions about your: Alcohol use. Tobacco  use. Drug use. Emotional well-being. Home and relationship well-being. Sexual activity. Eating habits. History of falls. Memory and ability to understand (cognition). Work and work Statistician. Reproductive health. Screening  You may have the following tests or measurements: Height, weight, and BMI. Blood pressure. Lipid and cholesterol levels. These may be checked every 5 years, or more frequently if you are over 3 years old. Skin check. Lung cancer screening. You may have this screening every year starting at age 32 if you have a 30-pack-year history of smoking and currently smoke or have quit within the past 15 years. Fecal occult blood test (FOBT) of the stool. You may have this test every year starting at age 11. Flexible sigmoidoscopy or colonoscopy. You may have a sigmoidoscopy every 5 years or a colonoscopy every 10 years starting at age 2. Hepatitis C blood test. Hepatitis B blood test. Sexually transmitted disease (STD) testing. Diabetes screening. This is done by checking your blood sugar (glucose) after you have not eaten for a while (fasting). You may have this done every 1-3 years. Bone density scan. This is done to screen for osteoporosis. You may have this done starting at age 33. Mammogram. This may be done every 1-2 years. Talk to your health care provider about how often you should have regular mammograms. Talk with your health care provider about your test results, treatment options, and if necessary, the need for more tests. Vaccines  Your health care provider may recommend certain vaccines, such as: Influenza vaccine. This is recommended every year. Tetanus, diphtheria, and acellular pertussis (Tdap, Td) vaccine. You may need a Td booster every 10 years. Zoster vaccine. You may need this after age 7. Pneumococcal 13-valent conjugate (PCV13) vaccine.  One dose is recommended after age 48. Pneumococcal polysaccharide (PPSV23) vaccine. One dose is recommended  after age 5. Talk to your health care provider about which screenings and vaccines you need and how often you need them. This information is not intended to replace advice given to you by your health care provider. Make sure you discuss any questions you have with your health care provider. Document Released: 07/15/2015 Document Revised: 03/07/2016 Document Reviewed: 04/19/2015 Elsevier Interactive Patient Education  2017 [employer] Prevention in the Home Falls can cause injuries. They can happen to people of all ages. There are many things you can do to make your home safe and to help prevent falls. What can I do on the outside of my home? Regularly fix the edges of walkways and driveways and fix any cracks. Remove anything that might make you trip as you walk through a door, such as a raised step or threshold. Trim any bushes or trees on the path to your home. Use bright outdoor lighting. Clear any walking paths of anything that might make someone trip, such as rocks or tools. Regularly check to see if handrails are loose or broken. Make sure that both sides of any steps have handrails. Any raised decks and porches should have guardrails on the edges. Have any leaves, snow, or ice cleared regularly. Use sand or salt on walking paths during winter. Clean up any spills in your garage right away. This includes oil or grease spills. What can I do in the bathroom? Use night lights. Install grab bars by the toilet and in the tub and shower. Do not use towel bars as grab bars. Use non-skid mats or decals in the tub or shower. If you need to sit down in the shower, use a plastic, non-slip stool. Keep the floor dry. Clean up any water that spills on the floor as soon as it happens. Remove soap buildup in the tub or shower regularly. Attach bath mats securely with double-sided non-slip rug tape. Do not have throw rugs and other things on the floor that can make you trip. What can I do  in the bedroom? Use night lights. Make sure that you have a light by your bed that is easy to reach. Do not use any sheets or blankets that are too big for your bed. They should not hang down onto the floor. Have a firm chair that has side arms. You can use this for support while you get dressed. Do not have throw rugs and other things on the floor that can make you trip. What can I do in the kitchen? Clean up any spills right away. Avoid walking on wet floors. Keep items that you use a lot in easy-to-reach places. If you need to reach something above you, use a strong step stool that has a grab bar. Keep electrical cords out of the way. Do not use floor polish or wax that makes floors slippery. If you must use wax, use non-skid floor wax. Do not have throw rugs and other things on the floor that can make you trip. What can I do with my stairs? Do not leave any items on the stairs. Make sure that there are handrails on both sides of the stairs and use them. Fix handrails that are broken or loose. Make sure that handrails are as long as the stairways. Check any carpeting to make sure that it is firmly attached to the stairs. Fix any carpet that is loose or  worn. Avoid having throw rugs at the top or bottom of the stairs. If you do have throw rugs, attach them to the floor with carpet tape. Make sure that you have a light switch at the top of the stairs and the bottom of the stairs. If you do not have them, ask someone to add them for you. What else can I do to help prevent falls? Wear shoes that: Do not have high heels. Have rubber bottoms. Are comfortable and fit you well. Are closed at the toe. Do not wear sandals. If you use a stepladder: Make sure that it is fully opened. Do not climb a closed stepladder. Make sure that both sides of the stepladder are locked into place. Ask someone to hold it for you, if possible. Clearly mark and make sure that you can see: Any grab bars or  handrails. First and last steps. Where the edge of each step is. Use tools that help you move around (mobility aids) if they are needed. These include: Canes. Walkers. Scooters. Crutches. Turn on the lights when you go into a dark area. Replace any light bulbs as soon as they burn out. Set up your furniture so you have a clear path. Avoid moving your furniture around. If any of your floors are uneven, fix them. If there are any pets around you, be aware of where they are. Review your medicines with your doctor. Some medicines can make you feel dizzy. This can increase your chance of falling. Ask your doctor what other things that you can do to help prevent falls. This information is not intended to replace advice given to you by your health care provider. Make sure you discuss any questions you have with your health care provider. Document Released: 04/14/2009 Document Revised: 11/24/2015 Document Reviewed: 07/23/2014 Elsevier Interactive Patient Education  2017 [employer].

## 2022-10-08 NOTE — Progress Notes (Signed)
  This encounter was created in error - please disregard. No show 

## 2022-12-25 ENCOUNTER — Other Ambulatory Visit: Payer: Self-pay

## 2022-12-25 DIAGNOSIS — Z79899 Other long term (current) drug therapy: Secondary | ICD-10-CM

## 2022-12-25 DIAGNOSIS — E782 Mixed hyperlipidemia: Secondary | ICD-10-CM

## 2022-12-25 DIAGNOSIS — Z780 Asymptomatic menopausal state: Secondary | ICD-10-CM

## 2023-01-03 ENCOUNTER — Other Ambulatory Visit: Payer: Self-pay | Admitting: Family

## 2023-01-03 DIAGNOSIS — I10 Essential (primary) hypertension: Secondary | ICD-10-CM

## 2023-01-16 ENCOUNTER — Other Ambulatory Visit: Payer: Medicare Other

## 2023-01-17 LAB — HEPATIC FUNCTION PANEL
AG Ratio: 1.3 (calc) (ref 1.0–2.5)
ALT: 11 U/L (ref 6–29)
AST: 13 U/L (ref 10–35)
Albumin: 4 g/dL (ref 3.6–5.1)
Alkaline phosphatase (APISO): 50 U/L (ref 37–153)
Bilirubin, Direct: 0.1 mg/dL (ref 0.0–0.2)
Globulin: 3 g/dL (calc) (ref 1.9–3.7)
Indirect Bilirubin: 0.3 mg/dL (calc) (ref 0.2–1.2)
Total Bilirubin: 0.4 mg/dL (ref 0.2–1.2)
Total Protein: 7 g/dL (ref 6.1–8.1)

## 2023-01-17 LAB — LIPID PANEL
Cholesterol: 184 mg/dL (ref <200)
HDL: 46 mg/dL — ABNORMAL LOW (ref >=50)
LDL Cholesterol (Calc): 118 mg/dL (calc) — ABNORMAL HIGH
Non-HDL Cholesterol (Calc): 138 mg/dL (calc) — ABNORMAL HIGH (ref <130)
Total CHOL/HDL Ratio: 4 (calc) (ref <5.0)
Triglycerides: 96 mg/dL (ref <150)

## 2023-01-18 ENCOUNTER — Other Ambulatory Visit: Payer: Medicare Other

## 2023-03-05 ENCOUNTER — Encounter: Payer: Self-pay | Admitting: Pharmacist

## 2023-03-07 ENCOUNTER — Encounter: Payer: Medicare Other | Admitting: Family

## 2023-03-19 NOTE — Progress Notes (Signed)
This encounter was created in error - please disregard.

## 2023-03-31 ENCOUNTER — Other Ambulatory Visit: Payer: Self-pay | Admitting: Family

## 2023-03-31 DIAGNOSIS — E782 Mixed hyperlipidemia: Secondary | ICD-10-CM

## 2023-06-19 ENCOUNTER — Other Ambulatory Visit: Payer: Self-pay | Admitting: Family

## 2023-06-19 DIAGNOSIS — Z1231 Encounter for screening mammogram for malignant neoplasm of breast: Secondary | ICD-10-CM

## 2023-06-30 ENCOUNTER — Other Ambulatory Visit: Payer: Self-pay | Admitting: Family

## 2023-06-30 DIAGNOSIS — I10 Essential (primary) hypertension: Secondary | ICD-10-CM

## 2023-07-10 DIAGNOSIS — Z1231 Encounter for screening mammogram for malignant neoplasm of breast: Secondary | ICD-10-CM

## 2023-07-16 ENCOUNTER — Other Ambulatory Visit: Payer: Self-pay | Admitting: Family

## 2023-07-16 DIAGNOSIS — Z1231 Encounter for screening mammogram for malignant neoplasm of breast: Secondary | ICD-10-CM

## 2023-07-25 ENCOUNTER — Ambulatory Visit
Admission: RE | Admit: 2023-07-25 | Discharge: 2023-07-25 | Disposition: A | Discharge status: Home / Self Care | Payer: Medicare Other | Source: Ambulatory Visit

## 2023-07-25 DIAGNOSIS — Z1231 Encounter for screening mammogram for malignant neoplasm of breast: Secondary | ICD-10-CM

## 2023-07-27 ENCOUNTER — Other Ambulatory Visit: Payer: Self-pay | Admitting: Family

## 2023-07-27 DIAGNOSIS — I10 Essential (primary) hypertension: Secondary | ICD-10-CM

## 2023-09-26 ENCOUNTER — Encounter: Admitting: Family

## 2023-10-03 ENCOUNTER — Ambulatory Visit (INDEPENDENT_AMBULATORY_CARE_PROVIDER_SITE_OTHER): Admitting: Family

## 2023-10-03 VITALS — BP 138/88 | HR 81 | Temp 97.8°F | Resp 20 | Ht 63.0 in | Wt 268.4 lb

## 2023-10-03 DIAGNOSIS — Z6841 Body Mass Index (BMI) 40.0 and over, adult: Secondary | ICD-10-CM

## 2023-10-03 DIAGNOSIS — E2839 Other primary ovarian failure: Secondary | ICD-10-CM

## 2023-10-03 DIAGNOSIS — R7303 Prediabetes: Secondary | ICD-10-CM

## 2023-10-03 DIAGNOSIS — I10 Essential (primary) hypertension: Secondary | ICD-10-CM | POA: Diagnosis not present

## 2023-10-03 DIAGNOSIS — J4520 Mild intermittent asthma, uncomplicated: Secondary | ICD-10-CM

## 2023-10-03 DIAGNOSIS — E66813 Obesity, class 3: Secondary | ICD-10-CM

## 2023-10-03 DIAGNOSIS — E782 Mixed hyperlipidemia: Secondary | ICD-10-CM

## 2023-10-03 MED ORDER — TELMISARTAN-HCTZ 80-12.5 MG PO TABS: 1.0000 | ORAL_TABLET | Freq: Every day | ORAL | 1 refills | Status: DC

## 2023-10-03 MED ORDER — PRAVASTATIN SODIUM 80 MG PO TABS: 80.0000 mg | ORAL_TABLET | Freq: Every day | ORAL | 1 refills | Status: DC

## 2023-10-03 MED ORDER — WEGOVY 0.25 MG/0.5ML ~~LOC~~ SOAJ: 0.2500 mg | SUBCUTANEOUS | 3 refills | Status: DC

## 2023-10-03 MED ORDER — METFORMIN HCL 500 MG PO TABS: 500.0000 mg | ORAL_TABLET | Freq: Two times a day (BID) | ORAL | 1 refills | Status: DC

## 2023-10-03 NOTE — Progress Notes (Signed)
 Provider: Richarda Blade FNP-C   Barrie Wale, Donalee Citrin, NP  Patient Care Team: Danni Leabo, Donalee Citrin, NP as PCP - General (Family Medicine)  Extended Emergency Contact Information Primary Emergency Contact: Kathy Juarez Mobile Phone: [phone] Relation: Son Secondary Emergency Contact: Kathy Juarez [place] of [place] Home Phone: [phone] Mobile Phone: [phone] Relation: Son  Code Status:  Full Code  Goals of care: Advanced Directive information    10/03/2023    1:53 PM  Advanced Directives  Does Patient Have a Medical Advance Directive? No  Would patient like information on creating a medical advance directive? No - Patient declined     Chief Complaint  Patient presents with   Medical Management of Chronic Issues    1 year follow up    Discussed the use of AI scribe software for clinical note transcription with the patient, who gave verbal consent to proceed.  History of Present Illness   Kathy Juarez "Kathy Juarez" is a 68 year old female who presents for a one-year follow-up and vaccination discussion.  She is unsure if she received a tetanus shot within the last ten years, as there is no record of it, but she recalls possibly getting it while seeing Dr. Allyne Gee or Dr. Renae Gloss at [facility]. She is concerned about the potential harm of receiving a booster if she has already had the shot within ten years.  She experiences intermittent arm pain following a flu shot administered at a [facility] pharmacy, which she associates with the injection. The pain affects her shoulder and forearm, sometimes accompanied by rare tingling but no weakness. She also experiences discomfort when lying on her side due to heart pain, described as indigestion-like burning. No palpitations or chest pain are reported.  Her current medications include aspirin, albuterol as needed, and she has run out of metformin. She is not currently taking her multivitamins or supplements due to cost but plans  to resume them. She is interested in weight loss medications and has previously lost over thirty pounds with a weight loss doctor. Her A1c was 6.3 one year ago, and she has been on metformin for about a year.  She engages in physical activity using a stationary bike for thirty minutes at least twice a week and uses dumbbells to strengthen her arms. She is trying to manage her diet, having recently resumed eating meat after a period of abstaining, which led to increased carbohydrate intake. She snacks on sweets and salty foods, often late at night due to her sleep schedule.  She has not yet had her bone density test, which was ordered in 2023. She plans to delay scheduling due to her mother's illness and recent travel to [facility] to visit her. Her family history includes her mother's current illness, requiring morphine for comfort, and her sister and niece are providing care.   Past Medical History:  Diagnosis Date   Abnormal EKG    Allergic rhinitis    Benign essential hypertension    Depressive disorder    High cholesterol    History of colonoscopy    History of mammogram    Hyperlipidemia    Hypertension    Obesity    Vitamin D deficiency    Past Surgical History:  Procedure Laterality Date   ABDOMINAL HYSTERECTOMY     CESAREAN SECTION     x 3   CHOLECYSTECTOMY      Allergies  Allergen Reactions   Fish Allergy    Latex Itching    Allergies as of  10/03/2023       Reactions   Fish Allergy    Latex Itching        Medication List        Accurate as of October 03, 2023  2:23 PM. If you have any questions, ask your nurse or doctor.          STOP taking these medications    EMERGEN-C IMMUNE PO Stopped by: Carilyn Goodpasture C Jeanae Whitmill   NON FORMULARY Stopped by: Donalee Citrin Zolton Dowson       TAKE these medications    albuterol 108 (90 Base) MCG/ACT inhaler Commonly known as: VENTOLIN HFA Inhale 1 puff into the lungs every 6 (six) hours as needed for wheezing or shortness of  breath.   aspirin EC 325 MG tablet Take 325 mg by mouth daily.   Co Q 10 100 MG Caps   Diethylpropion HCl 25 MG Tabs Take 25 mg by mouth in the morning and at bedtime.   Flaxseed Oil 1400 MG Caps   metFORMIN 500 MG tablet Commonly known as: GLUCOPHAGE Take 500 mg by mouth 2 (two) times daily with a meal.   pravastatin 80 MG tablet Commonly known as: PRAVACHOL TAKE ONE TABLET BY MOUTH ONE TIME DAILY   telmisartan-hydrochlorothiazide 80-12.5 MG tablet Commonly known as: MICARDIS HCT TAKE ONE TABLET BY MOUTH ONE TIME DAILY   Vitamin B-12 500 MCG Lozg        Review of Systems  Constitutional:  Negative for appetite change, chills, fatigue, fever and unexpected weight change.  HENT:  Negative for congestion, dental problem, ear discharge, ear pain, facial swelling, hearing loss, nosebleeds, postnasal drip, rhinorrhea, sinus pressure, sinus pain, sneezing, sore throat, tinnitus and trouble swallowing.   Eyes:  Negative for pain, discharge, redness, itching and visual disturbance.  Respiratory:  Negative for cough, chest tightness, shortness of breath and wheezing.   Cardiovascular:  Negative for chest pain, palpitations and leg swelling.  Gastrointestinal:  Negative for abdominal distention, abdominal pain, blood in stool, constipation, diarrhea, nausea and vomiting.  Endocrine: Negative for cold intolerance, heat intolerance, polydipsia, polyphagia and polyuria.  Genitourinary:  Negative for difficulty urinating, dysuria, flank pain, frequency and urgency.  Musculoskeletal:  Negative for arthralgias, back pain, gait problem, joint swelling, myalgias, neck pain and neck stiffness.  Skin:  Negative for color change, pallor, rash and wound.  Neurological:  Negative for dizziness, syncope, speech difficulty, weakness, light-headedness, numbness and headaches.  Hematological:  Does not bruise/bleed easily.  Psychiatric/Behavioral:  Negative for agitation, behavioral problems,  confusion, hallucinations, self-injury, sleep disturbance and suicidal ideas. The patient is not nervous/anxious.     Immunization History  Administered Date(s) Administered   Influenza Inj Mdck Quad Pf 03/28/2017   Influenza,inj,Quad PF,6+ Mos 03/13/2015, 04/04/2018   Influenza-Unspecified 04/05/2019, 04/01/2021, 04/09/2022   Moderna Sars-Covid-2 Vaccination 08/14/2019, 09/14/2019, 05/21/2020, 04/26/2021   PNEUMOCOCCAL CONJUGATE-20 01/03/2022   Pertinent  Health Maintenance Due  Topic Date Due   DEXA SCAN  Never done   INFLUENZA VACCINE  01/31/2024   MAMMOGRAM  07/24/2025   Colonoscopy  12/29/2029      07/28/2021    8:03 AM 01/03/2022   10:28 AM 09/03/2022    9:38 AM 09/18/2022    1:49 PM 10/03/2023    1:52 PM  Fall Risk  Falls in the past year?  0  0 0  Was there an injury with Fall?  0 1 0 0  Fall Risk Category Calculator  0  0 0  Fall Risk Category (Retired)  Low     (RETIRED) Patient Fall Risk Level Low fall risk Low fall risk     Patient at Risk for Falls Due to  No Fall Risks  No Fall Risks No Fall Risks  Fall risk Follow up  Falls evaluation completed   Falls evaluation completed   Functional Status Survey:    Vitals:   10/03/23 1356  BP: 138/88  Pulse: 81  Resp: 20  Temp: 97.8 F (36.6 C)  SpO2: 96%  Weight: 268 lb 6.4 oz (121.7 kg)  Height: 5\' 3"  (1.6 m)   Body mass index is 47.54 kg/m. Physical Exam MEASUREMENTS: BMI- 47.5. GENERAL: Alert, cooperative, well developed, no acute distress. HEENT: Normocephalic, normal oropharynx, moist mucous membranes, ears normal bilaterally, nose normal, vision grossly intact, no sinus tenderness. CHEST: Clear to auscultation bilaterally, no wheezes, rhonchi, or crackles. CARDIOVASCULAR: Normal heart rate and rhythm, S1 and S2 normal without murmurs. ABDOMEN: Soft, non-tender, non-distended, without organomegaly, normal bowel sounds, no splenomegaly, liver normal. EXTREMITIES: No cyanosis or edema, no peripheral  edema. MUSCULOSKELETAL: No spinal tenderness, knees without swelling or pain. NEUROLOGICAL: Cranial nerves grossly intact, moves all extremities without gross motor or sensory deficit.  SKIN: No rash,no lesion or erythema   PSYCHIATRY/BEHAVIORAL: Mood stable   Labs reviewed: No results for input(s): "NA", "K", "CL", "CO2", "GLUCOSE", "BUN", "CREATININE", "CALCIUM", "MG", "PHOS" in the last 8760 hours. Recent Labs    01/16/23 0812  AST 13  ALT 11  BILITOT 0.4  PROT 7.0   No results for input(s): "WBC", "NEUTROABS", "HGB", "HCT", "MCV", "PLT" in the last 8760 hours. Lab Results  Component Value Date   TSH 1.77 09/03/2022   Lab Results  Component Value Date   HGBA1C 6.3 (H) 09/03/2022   Lab Results  Component Value Date   CHOL 184 01/16/2023   HDL 46 (L) 01/16/2023   LDLCALC 118 (H) 01/16/2023   TRIG 96 01/16/2023   CHOLHDL 4.0 01/16/2023    Significant Diagnostic Results in last 30 days:  No results found.  Assessment/Plan  Left Shoulder pain Intermittent shoulder pain following a flu shot, occasionally radiating to the forearm. No numbness, significant weakness, or activity association. Consideration of a pinched nerve or flu shot reaction, but no definitive diagnosis. - consider obtaining imaging and referral to orthopedic   Prediabetes A1c of 6.3 indicates prediabetes. On metformin for a year with some weight loss. Concerned about A1c levels. Discussed Mounjaro or [id number] for weight management, depending on A1c results. Explained dosing schedule and potential side effects, including nausea and constipation. Prefers Wegovy if A1c is below 6.5. - Refill metformin prescription for 90 days with one refill - Order blood work to check A1c and other parameters - Consider starting ZOXWRU for weight management if A1c is below 6.5  Obesity BMI of 47.5 qualifies for weight management medications like Mounjaro or Wegovy. Emphasized exercise and dietary modifications, including  reducing carbohydrates and increasing protein and vegetables. Engages in biking and dumbbell exercises, aims to increase frequency. Regular exercise needed for weight management and overall health improvement. - Encourage exercise at least three times a week - Advise dietary modifications to reduce carbohydrate intake and increase protein and vegetable consumption  Hyperlipidemia On pravastatin for cholesterol management.  Hypertension B/p well controlled  On hydrochlorothiazide for blood pressure management. - Refill hydrochlorothiazide prescription  Tetanus vaccination Uncertain about tetanus booster within the last ten years. No record of administration, but recalls possible administration at [facility]. - Schedule tetanus vaccination at the pharmacy  Shingles vaccination Interested in receiving the shingles vaccine and requested more information. Vaccine can be administered at the pharmacy. - Schedule shingles vaccination at the pharmacy  General Health Maintenance Due for a bone density test, ordered in 2023 but not completed. Discussed importance of scheduling. Needs to schedule a Medicare visit and has not had recent dental or eye exams. Encouraged regular health check-ups. - Order bone density test - Schedule Medicare visit - Encourage dental and eye examinations  Follow-up Plans to complete lab work and schedule Medicare visit. Considering new job and managing family responsibilities, which may affect appointment availability. - Schedule lab work for tomorrow - Schedule Medicare visit - Follow up in one month to assess response to [facility] if started   Family/ staff Communication: Reviewed plan of care with patient verbalized understanding   Labs/tests ordered:  - DG Bone density  - CBC with Differential/Platelet - CMP with eGFR([facility]) - TSH - Hgb A1C - Lipid panel  Next Appointment :Return in about 6 months (around 04/03/2024) for medical mangement of chronic  issues., Fasting labs in the morning.AWV soon. Marland Kitchen  Spent 30 minutes of Face to face and non-face to face with patient  >50% time spent counseling; reviewing medical record; tests; labs; documentation and developing future plan of care.   Caesar Bookman, NP

## 2023-10-04 ENCOUNTER — Other Ambulatory Visit

## 2023-10-04 DIAGNOSIS — R7303 Prediabetes: Secondary | ICD-10-CM

## 2023-10-04 DIAGNOSIS — I10 Essential (primary) hypertension: Secondary | ICD-10-CM

## 2023-10-04 DIAGNOSIS — E782 Mixed hyperlipidemia: Secondary | ICD-10-CM

## 2023-10-05 LAB — COMPLETE METABOLIC PANEL WITHOUT GFR
AG Ratio: 1.5 (calc) (ref 1.0–2.5)
ALT: 16 U/L (ref 6–29)
AST: 14 U/L (ref 10–35)
Albumin: 3.9 g/dL (ref 3.6–5.1)
Alkaline phosphatase (APISO): 48 U/L (ref 37–153)
BUN: 17 mg/dL (ref 7–25)
CO2: 26 mmol/L (ref 20–32)
Calcium: 9.1 mg/dL (ref 8.6–10.4)
Chloride: 105 mmol/L (ref 98–110)
Creat: 0.73 mg/dL (ref 0.50–1.05)
Globulin: 2.6 g/dL (ref 1.9–3.7)
Glucose, Bld: 108 mg/dL — ABNORMAL HIGH (ref 65–99)
Potassium: 3.8 mmol/L (ref 3.5–5.3)
Sodium: 141 mmol/L (ref 135–146)
Total Bilirubin: 0.4 mg/dL (ref 0.2–1.2)
Total Protein: 6.5 g/dL (ref 6.1–8.1)

## 2023-10-05 LAB — CBC WITH DIFFERENTIAL/PLATELET
Absolute Lymphocytes: 2214 {cells}/uL (ref 850–3900)
Absolute Monocytes: 455 {cells}/uL (ref 200–950)
Basophils Absolute: 41 {cells}/uL (ref 0–200)
Basophils Relative: 0.9 %
Eosinophils Absolute: 203 {cells}/uL (ref 15–500)
Eosinophils Relative: 4.5 %
HCT: 40.3 % (ref 35.0–45.0)
Hemoglobin: 13.5 g/dL (ref 11.7–15.5)
MCH: 31.8 pg (ref 27.0–33.0)
MCHC: 33.5 g/dL (ref 32.0–36.0)
MCV: 94.8 fL (ref 80.0–100.0)
MPV: 9.8 fL (ref 7.5–12.5)
Monocytes Relative: 10.1 %
Neutro Abs: 1589 {cells}/uL (ref 1500–7800)
Neutrophils Relative %: 35.3 %
Platelets: 230 10*3/uL (ref 140–400)
RBC: 4.25 10*6/uL (ref 3.80–5.10)
RDW: 12.7 % (ref 11.0–15.0)
Total Lymphocyte: 49.2 %
WBC: 4.5 10*3/uL (ref 3.8–10.8)

## 2023-10-05 LAB — TSH: TSH: 2.39 m[IU]/L (ref 0.40–4.50)

## 2023-10-05 LAB — HEMOGLOBIN A1C
Hgb A1c MFr Bld: 6.4 %{Hb} — ABNORMAL HIGH (ref <5.7)
Mean Plasma Glucose: 137 mg/dL
eAG (mmol/L): 7.6 mmol/L

## 2023-10-05 LAB — LIPID PANEL
Cholesterol: 174 mg/dL (ref <200)
HDL: 58 mg/dL (ref >=50)
LDL Cholesterol (Calc): 100 mg/dL — ABNORMAL HIGH
Non-HDL Cholesterol (Calc): 116 mg/dL (ref <130)
Total CHOL/HDL Ratio: 3 (calc) (ref <5.0)
Triglycerides: 75 mg/dL (ref <150)

## 2023-10-09 ENCOUNTER — Telehealth: Payer: Self-pay

## 2023-10-09 NOTE — Telephone Encounter (Signed)
 Incoming fax received from patients pharmacy to initiate a prior authorization for  [facility], Kathy Juarez & Kathy Ju[facility]            .  PA initiated through covermymeds. Key: [id number]  Awaiting reply from the insurance company which will be determined in 48-72 hours.

## 2023-10-11 ENCOUNTER — Encounter: Payer: Self-pay | Admitting: Family

## 2023-10-11 ENCOUNTER — Ambulatory Visit (INDEPENDENT_AMBULATORY_CARE_PROVIDER_SITE_OTHER): Admitting: Family

## 2023-10-11 DIAGNOSIS — Z0000 Encounter for general adult medical examination without abnormal findings: Secondary | ICD-10-CM

## 2023-10-11 NOTE — Progress Notes (Addendum)
 This service is provided via telemedicine  No vital signs collected/recorded due to the encounter was a telemedicine visit.   Location of patient (ex: home, work):  Home  Patient consents to a telephone visit:  Yes   Location of the provider (ex: office, home):  [employer].  Name of any referring provider:  Abiola Behring C, NP   Names of all persons participating in the telemedicine service and their role in the encounter:  Patient, Kathy Juarez, RMA, Toris Laverdiere, Nedra Ball, NP.    Time spent on call: 8 minutes spent on the phone with Medical Assistant.       Subjective:   Kathy Juarez is a 68 y.o. female who presents for Medicare Annual (Subsequent) preventive examination.  Visit Complete: virtual video  Patient Medicare AWV questionnaire was completed by the patient on 10/11/2023 ; I have confirmed that all information answered by patient is correct and no changes since this date.  Cardiac Risk Factors include: advanced age (>49men, >28 women);diabetes mellitus;obesity (BMI >30kg/m2);hypertension;sedentary lifestyle     Objective:    There were no vitals filed for this visit. There is no height or weight on file to calculate BMI.     10/03/2023    1:53 PM 09/18/2022    1:49 PM 09/03/2022    9:35 AM 01/03/2022   10:28 AM 07/28/2021    8:03 AM 04/28/2021    1:40 PM  Advanced Directives  Does Patient Have a Medical Advance Directive? No No No No No No  Would patient like information on creating a medical advance directive? No - Patient declined No - Patient declined No - Patient declined No - Patient declined  No - Patient declined    Current Medications (verified) Outpatient Encounter Medications as of 10/11/2023  Medication Sig   albuterol  (VENTOLIN  HFA) 108 (90 Base) MCG/ACT inhaler Inhale 1 puff into the lungs every 6 (six) hours as needed for wheezing or shortness of breath.   aspirin 325 MG EC tablet Take 325 mg by mouth daily.   metFORMIN   (GLUCOPHAGE ) 500 MG tablet Take 1 tablet (500 mg total) by mouth 2 (two) times daily with a meal.   pravastatin  (PRAVACHOL ) 80 MG tablet Take 1 tablet (80 mg total) by mouth daily.   telmisartan -hydrochlorothiazide (MICARDIS  HCT) 80-12.5 MG tablet Take 1 tablet by mouth daily.   Coenzyme Q10 (CO Q 10) 100 MG CAPS  (Patient not taking: Reported on 10/11/2023)   Cyanocobalamin  (VITAMIN B-12) 500 MCG LOZG  (Patient not taking: Reported on 10/11/2023)   Flaxseed, Linseed, (FLAXSEED OIL) 1400 MG CAPS  (Patient not taking: Reported on 10/11/2023)   Semaglutide -Weight Management (WEGOVY ) 0.25 MG/0.5ML SOAJ Inject 0.25 mg into the skin once a week. (Patient not taking: Reported on 10/11/2023)   No facility-administered encounter medications on file as of 10/11/2023.    Allergies (verified) Fish allergy and Latex   History: Past Medical History:  Diagnosis Date   Abnormal EKG    Allergic rhinitis    Benign essential hypertension    Depressive disorder    High cholesterol    History of colonoscopy    History of mammogram    Hyperlipidemia    Hypertension    Obesity    Vitamin D deficiency    Past Surgical History:  Procedure Laterality Date   ABDOMINAL HYSTERECTOMY     CESAREAN SECTION     x 3   CHOLECYSTECTOMY     Family History  Problem Relation Age of Onset  Hypertension Mother    Alcohol abuse Father    Myasthenia gravis Sister    Stroke Maternal Grandmother    Hypertension Maternal Grandmother    Ulcers Maternal Grandmother    Heart attack Maternal Grandfather    Social History   Socioeconomic History   Marital status: Divorced    Spouse name: Not on file   Number of children: Not on file   Years of education: Not on file   Highest education level: Master's degree (e.g., MA, MS, MEng, MEd, MSW, MBA)  Occupational History   Not on file  Tobacco Use   Smoking status: Never   Smokeless tobacco: Never  Vaping Use   Vaping status: Never Used  Substance and Sexual  Activity   Alcohol use: No   Drug use: No   Sexual activity: Not on file  Other Topics Concern   Not on file  Social History Narrative   Tobacco use, amount per day now: None   Past tobacco use, amount per day: None   How many years did you use tobacco: N/A   Alcohol use (drinks per week): N/A   Diet:   Do you drink/eat things with caffeine: yes on occasion.   Marital status:    Divorced                              What year were you married? 1982   Do you live in a house, apartment, assisted living, condo, trailer, etc.? House   Is it one or more stories? One Story.   How many persons live in your home? Just myself.   Do you have pets in your home?( please list) No ( dog died just two weeks ago )   Highest Level of education completed? Masters Degree.   Current or past profession: Sports coach for [employer].    Do you exercise?  Very little at present                                Type and how often?   Do you have a living will? No   Do you have a DNR form?  No                                  If not, do you want to discuss one?   Do you have signed POA/HPOA forms? No                       If so, please bring to you appointment      Do you have any difficulty bathing or dressing yourself? No   Do you have any difficulty preparing food or eating? No   Do you have any difficulty managing your medications? No   Do you have any difficulty managing your finances? No   Do you have any difficulty affording your medications? No   Social Drivers of Corporate investment banker Strain: Low Risk  (10/03/2023)   Overall Financial Resource Strain (CARDIA)    Difficulty of Paying Living Expenses: Not hard at all  Food Insecurity: No Food Insecurity (10/03/2023)   Hunger Vital Sign    Worried About Running Out of Food in the Last Year: Never true    Ran Out of Food  in the Last Year: Never true  Transportation Needs: No Transportation Needs (10/03/2023)   PRAPARE -  Administrator, Civil Service (Medical): No    Lack of Transportation (Non-Medical): No  Physical Activity: Insufficiently Active (10/03/2023)   Exercise Vital Sign    Days of Exercise per Week: 3 days    Minutes of Exercise per Session: 30 min  Stress: No Stress Concern Present (10/03/2023)   [employer] of Occupational Health - Occupational Stress Questionnaire    Feeling of Stress : Not at all  Social Connections: Moderately Isolated (10/03/2023)   Social Connection and Isolation Panel [NHANES]    Frequency of Communication with Friends and Family: More than three times a week    Frequency of Social Gatherings with Friends and Family: Twice a week    Attends Religious Services: 1 to 4 times per year    Active Member of [employer] or Organizations: No    Attends Engineer, structural: Not on file    Marital Status: Divorced    Tobacco Counseling Counseling given: Not Answered   Clinical Intake:  Pre-visit preparation completed: No  Pain : No/denies pain   BMI - recorded: 47.54 Nutritional Status: BMI > 30  Obese Nutritional Risks: None Diabetes: No CBG done?: No Did pt. bring in CBG monitor from home?: No  How often do you need to have someone help you when you read instructions, pamphlets, or other written materials from your doctor or pharmacy?: 1 - Never What is the last grade level you completed in school?: Masteer's degree  Interpreter Needed?: No  Activities of Daily Living    10/11/2023    1:06 PM 10/10/2023    3:05 PM  In your present state of health, do you have any difficulty performing the following activities:  Hearing? 0 0  Vision? 0 0  Difficulty concentrating or making decisions? 0 0  Walking or climbing stairs? 0 0  Dressing or bathing? 0 0  Doing errands, shopping? 0 0  Preparing Food and eating ? N N  Using the Toilet? N N  In the past six months, have you accidently leaked urine? Y Y  Comment sometimes   Do you have problems  with loss of bowel control? N N  Managing your Medications? N N  Managing your Finances? N N  Housekeeping or managing your Housekeeping? N N    Patient Care Team: Tirso Laws C, NP as PCP - General (Family Medicine)  Indicate any recent Medical Services you may have received from other than [facility] providers in the past year (date may be approximate).     Assessment:   This is a routine wellness examination for Kathy Juarez.  Hearing/Vision screen Hearing Screening - Comments:: No hearing concerns.  Vision Screening - Comments:: No vision concerns. Patient last eye exam has been a while. Patient wears prescription glasses.    Goals Addressed             This Visit's Progress    Patient Stated   On track    Loss weight        Depression Screen    10/11/2023   12:39 PM 10/03/2023    1:52 PM 09/18/2022    1:50 PM 09/03/2022    9:38 AM 12/31/2018    8:40 AM 12/29/2018    2:09 PM  PHQ 2/9 Scores  PHQ - 2 Score 0 0 0 0 0 0  PHQ- 9 Score  0  Fall Risk    10/11/2023   12:39 PM 10/10/2023    3:05 PM 10/03/2023    1:52 PM 09/18/2022    1:49 PM 09/03/2022    9:38 AM  Fall Risk   Falls in the past year? 0 0 0 0   Number falls in past yr: 0  0 0 0  Injury with Fall? 0  0 0 1  Risk for fall due to : No Fall Risks  No Fall Risks No Fall Risks   Follow up Falls evaluation completed  Falls evaluation completed      MEDICARE RISK AT HOME: Medicare Risk at Home Any stairs in or around the home?: No If so, are there any without handrails?: No Home free of loose throw rugs in walkways, pet beds, electrical cords, etc?: Yes Adequate lighting in your home to reduce risk of falls?: Yes Life alert?: No Use of a cane, walker or w/c?: No Grab bars in the bathroom?: No Shower chair or bench in shower?: No Elevated toilet seat or a handicapped toilet?: No  TIMED UP AND GO:  Was the test performed?  No    Cognitive Function:        10/11/2023   12:41 PM 09/18/2022    1:50 PM   6CIT Screen  What Year? 0 points 0 points  What month? 0 points 0 points  What time? 0 points 0 points  Count back from 20 0 points 0 points  Months in reverse 0 points 0 points  Repeat phrase 0 points 0 points  Total Score 0 points 0 points    Immunizations Immunization History  Administered Date(s) Administered   Influenza Inj Mdck Quad Pf 03/28/2017   Influenza,inj,Quad PF,6+ Mos 03/13/2015, 04/04/2018   Influenza-Unspecified 04/05/2019, 04/01/2021, 04/09/2022   Moderna Sars-Covid-2 Vaccination 08/14/2019, 09/14/2019, 05/21/2020, 04/26/2021   PNEUMOCOCCAL CONJUGATE-20 01/03/2022    TDAP status: Due, Education has been provided regarding the importance of this vaccine. Advised may receive this vaccine at local pharmacy or Health Dept. Aware to provide a copy of the vaccination record if obtained from local pharmacy or Health Dept. Verbalized acceptance and understanding.  Flu Vaccine status: Up to date  Pneumococcal vaccine status: Up to date  Covid-19 vaccine status: Information provided on how to obtain vaccines.   Qualifies for Shingles Vaccine? Yes   Zostavax completed No   Shingrix Completed?: No.    Education has been provided regarding the importance of this vaccine. Patient has been advised to call insurance company to determine out of pocket expense if they have not yet received this vaccine. Advised may also receive vaccine at local pharmacy or Health Dept. Verbalized acceptance and understanding.  Screening Tests Health Maintenance  Topic Date Due   DTaP/Tdap/Td (1 - Tdap) Never done   DEXA SCAN  Never done   Zoster Vaccines- Shingrix (1 of 2) 01/02/2024 (Originally 06/06/1975)   COVID-19 Vaccine (5 - 2024-25 season) 05/04/2024 (Originally 03/03/2023)   INFLUENZA VACCINE  01/31/2024   Medicare Annual Wellness (AWV)  10/10/2024   MAMMOGRAM  07/24/2025   Colonoscopy  12/29/2029   Pneumonia Vaccine 38+ Years old  Completed   Hepatitis C Screening  Completed   HPV  VACCINES  Aged Out   Meningococcal B Vaccine  Aged Out    Health Maintenance  Health Maintenance Due  Topic Date Due   DTaP/Tdap/Td (1 - Tdap) Never done   DEXA SCAN  Never done    Colorectal cancer screening: Type of screening: Colonoscopy. Completed  12/30/2019. Repeat every 10 years  Mammogram status: Completed 07/25/2023. Repeat every year  Bone Density status: Ordered schedule 06/10/2024 . Pt provided with contact info and advised to call to schedule appt.  Lung Cancer Screening: (Low Dose CT Chest recommended if Age 38-80 years, 20 pack-year currently smoking OR have quit w/in 15years.) does not qualify.   Lung Cancer Screening Referral: N/A  N Additional Screening:  Hepatitis C Screening: does qualify; Completed Yes   Vision Screening: Recommended annual ophthalmology exams for early detection of glaucoma and other disorders of the eye. Is the patient up to date with their annual eye exam?  No  Who is the provider or what is the name of the office in which the patient attends annual eye exams? No  If pt is not established with a provider, would they like to be referred to a provider to establish care? Yes .   Dental Screening: Recommended annual dental exams for proper oral hygiene  Diabetic Foot Exam: Diabetic Foot Exam: Completed N/A   Community Resource Referral / Chronic Care Management: CRR required this visit?  No   CCM required this visit?  No     Plan:     I have personally reviewed and noted the following in the patient's chart:   Medical and social history Use of alcohol, tobacco or illicit drugs  Current medications and supplements including opioid prescriptions. Patient is not currently taking opioid prescriptions. Functional ability and status Nutritional status Physical activity Advanced directives List of other physicians Hospitalizations, surgeries, and ER visits in previous 12 months Vitals Screenings to include cognitive, depression, and  falls Referrals and appointments  In addition, I have reviewed and discussed with patient certain preventive protocols, quality metrics, and best practice recommendations. A written personalized care plan for preventive services as well as general preventive health recommendations were provided to patient.    Estil Heman, NP   10/11/2023   After Visit Summary: (MyChart) Due to this being a telephonic visit, the after visit summary with patients personalized plan was offered to patient via MyChart   Nurse Notes: Advised to get Tdap and shingles vaccine at the pharmacy   I connected with  Kathy Juarez on 10/11/23 by a video enabled telemedicine application and verified that I am speaking with the correct person using two identifiers.   I discussed the limitations of evaluation and management by telemedicine. The patient expressed understanding and agreed to proceed.   Spent 20 minutes of non-face to face with patient  >50% time spent counseling; reviewing medical record; tests; labs; and developing future plan of care.

## 2023-10-11 NOTE — Patient Instructions (Signed)
 Ms. Kathy Juarez , Thank you for taking time to come for your Medicare Wellness Visit. I appreciate your ongoing commitment to your health goals. Please review the following plan we discussed and let me know if I can assist you in the future.   Screening recommendations/referrals: Colonoscopy : Up to date  Mammogram : Up to date  Bone Density : Up to date  Recommended yearly ophthalmology/optometry visit for glaucoma screening and checkup Recommended yearly dental visit for hygiene and checkup  Vaccinations: Influenza vaccine- due annually in September/October Pneumococcal vaccine : Up to date  Tdap vaccine : Due.Please get vaccine at the pharmacy    Shingles vaccine Due.Please get vaccine at the pharmacy     Advanced directives: No   Conditions/risks identified:  advanced age (>54men, >84 women);diabetes mellitus;obesity (BMI >30kg/m2);hypertension;sedentary lifestyle  Next appointment: 1 year    Preventive Care 55 Years and Older, Female Preventive care refers to lifestyle choices and visits with your health care provider that can promote health and wellness. What does preventive care include? A yearly physical exam. This is also called an annual well check. Dental exams once or twice a year. Routine eye exams. Ask your health care provider how often you should have your eyes checked. Personal lifestyle choices, including: Daily care of your teeth and gums. Regular physical activity. Eating a healthy diet. Avoiding tobacco and drug use. Limiting alcohol use. Practicing safe sex. Taking low-dose aspirin every day. Taking vitamin and mineral supplements as recommended by your health care provider. What happens during an annual well check? The services and screenings done by your health care provider during your annual well check will depend on your age, overall health, lifestyle risk factors, and family history of disease. Counseling  Your health care provider may ask you questions  about your: Alcohol use. Tobacco use. Drug use. Emotional well-being. Home and relationship well-being. Sexual activity. Eating habits. History of falls. Memory and ability to understand (cognition). Work and work Astronomer. Reproductive health. Screening  You may have the following tests or measurements: Height, weight, and BMI. Blood pressure. Lipid and cholesterol levels. These may be checked every 5 years, or more frequently if you are over 6 years old. Skin check. Lung cancer screening. You may have this screening every year starting at age 100 if you have a 30-pack-year history of smoking and currently smoke or have quit within the past 15 years. Fecal occult blood test (FOBT) of the stool. You may have this test every year starting at age 88. Flexible sigmoidoscopy or colonoscopy. You may have a sigmoidoscopy every 5 years or a colonoscopy every 10 years starting at age 20. Hepatitis C blood test. Hepatitis B blood test. Sexually transmitted disease (STD) testing. Diabetes screening. This is done by checking your blood sugar (glucose) after you have not eaten for a while (fasting). You may have this done every 1-3 years. Bone density scan. This is done to screen for osteoporosis. You may have this done starting at age 75. Mammogram. This may be done every 1-2 years. Talk to your health care provider about how often you should have regular mammograms. Talk with your health care provider about your test results, treatment options, and if necessary, the need for more tests. Vaccines  Your health care provider may recommend certain vaccines, such as: Influenza vaccine. This is recommended every year. Tetanus, diphtheria, and acellular pertussis (Tdap, Td) vaccine. You may need a Td booster every 10 years. Zoster vaccine. You may need this after age 18.  Pneumococcal 13-valent conjugate (PCV13) vaccine. One dose is recommended after age 53. Pneumococcal polysaccharide (PPSV23)  vaccine. One dose is recommended after age 7. Talk to your health care provider about which screenings and vaccines you need and how often you need them. This information is not intended to replace advice given to you by your health care provider. Make sure you discuss any questions you have with your health care provider. Document Released: 07/15/2015 Document Revised: 03/07/2016 Document Reviewed: 04/19/2015 Elsevier Interactive Patient Education  2017 [employer].  Fall Prevention in the Home Falls can cause injuries. They can happen to people of all ages. There are many things you can do to make your home safe and to help prevent falls. What can I do on the outside of my home? Regularly fix the edges of walkways and driveways and fix any cracks. Remove anything that might make you trip as you walk through a door, such as a raised step or threshold. Trim any bushes or trees on the path to your home. Use bright outdoor lighting. Clear any walking paths of anything that might make someone trip, such as rocks or tools. Regularly check to see if handrails are loose or broken. Make sure that both sides of any steps have handrails. Any raised decks and porches should have guardrails on the edges. Have any leaves, snow, or ice cleared regularly. Use sand or salt on walking paths during winter. Clean up any spills in your garage right away. This includes oil or grease spills. What can I do in the bathroom? Use night lights. Install grab bars by the toilet and in the tub and shower. Do not use towel bars as grab bars. Use non-skid mats or decals in the tub or shower. If you need to sit down in the shower, use a plastic, non-slip stool. Keep the floor dry. Clean up any water that spills on the floor as soon as it happens. Remove soap buildup in the tub or shower regularly. Attach bath mats securely with double-sided non-slip rug tape. Do not have throw rugs and other things on the floor that  can make you trip. What can I do in the bedroom? Use night lights. Make sure that you have a light by your bed that is easy to reach. Do not use any sheets or blankets that are too big for your bed. They should not hang down onto the floor. Have a firm chair that has side arms. You can use this for support while you get dressed. Do not have throw rugs and other things on the floor that can make you trip. What can I do in the kitchen? Clean up any spills right away. Avoid walking on wet floors. Keep items that you use a lot in easy-to-reach places. If you need to reach something above you, use a strong step stool that has a grab bar. Keep electrical cords out of the way. Do not use floor polish or wax that makes floors slippery. If you must use wax, use non-skid floor wax. Do not have throw rugs and other things on the floor that can make you trip. What can I do with my stairs? Do not leave any items on the stairs. Make sure that there are handrails on both sides of the stairs and use them. Fix handrails that are broken or loose. Make sure that handrails are as long as the stairways. Check any carpeting to make sure that it is firmly attached to the stairs. Fix any  carpet that is loose or worn. Avoid having throw rugs at the top or bottom of the stairs. If you do have throw rugs, attach them to the floor with carpet tape. Make sure that you have a light switch at the top of the stairs and the bottom of the stairs. If you do not have them, ask someone to add them for you. What else can I do to help prevent falls? Wear shoes that: Do not have high heels. Have rubber bottoms. Are comfortable and fit you well. Are closed at the toe. Do not wear sandals. If you use a stepladder: Make sure that it is fully opened. Do not climb a closed stepladder. Make sure that both sides of the stepladder are locked into place. Ask someone to hold it for you, if possible. Clearly mark and make sure that you  can see: Any grab bars or handrails. First and last steps. Where the edge of each step is. Use tools that help you move around (mobility aids) if they are needed. These include: Canes. Walkers. Scooters. Crutches. Turn on the lights when you go into a dark area. Replace any light bulbs as soon as they burn out. Set up your furniture so you have a clear path. Avoid moving your furniture around. If any of your floors are uneven, fix them. If there are any pets around you, be aware of where they are. Review your medicines with your doctor. Some medicines can make you feel dizzy. This can increase your chance of falling. Ask your doctor what other things that you can do to help prevent falls. This information is not intended to replace advice given to you by your health care provider. Make sure you discuss any questions you have with your health care provider. Document Released: 04/14/2009 Document Revised: 11/24/2015 Document Reviewed: 07/23/2014 Elsevier Interactive Patient Education  2017 [employer].

## 2024-03-12 ENCOUNTER — Ambulatory Visit: Admitting: Family

## 2024-04-03 ENCOUNTER — Ambulatory Visit: Admitting: Family

## 2024-04-03 ENCOUNTER — Encounter: Payer: Self-pay | Admitting: Family

## 2024-04-03 VITALS — BP 134/82 | HR 88 | Temp 97.6°F | Resp 19 | Ht 63.0 in | Wt 281.2 lb

## 2024-04-03 DIAGNOSIS — E66813 Obesity, class 3: Secondary | ICD-10-CM

## 2024-04-03 DIAGNOSIS — J4520 Mild intermittent asthma, uncomplicated: Secondary | ICD-10-CM

## 2024-04-03 DIAGNOSIS — E661 Drug-induced obesity: Secondary | ICD-10-CM

## 2024-04-03 DIAGNOSIS — E782 Mixed hyperlipidemia: Secondary | ICD-10-CM

## 2024-04-03 DIAGNOSIS — Z6841 Body Mass Index (BMI) 40.0 and over, adult: Secondary | ICD-10-CM

## 2024-04-03 DIAGNOSIS — R7303 Prediabetes: Secondary | ICD-10-CM

## 2024-04-03 DIAGNOSIS — Z23 Encounter for immunization: Secondary | ICD-10-CM

## 2024-04-03 DIAGNOSIS — I10 Essential (primary) hypertension: Secondary | ICD-10-CM

## 2024-04-03 DIAGNOSIS — Z111 Encounter for screening for respiratory tuberculosis: Secondary | ICD-10-CM

## 2024-04-03 DIAGNOSIS — H9193 Unspecified hearing loss, bilateral: Secondary | ICD-10-CM

## 2024-04-03 MED ORDER — PRAVASTATIN SODIUM 80 MG PO TABS: 80.0000 mg | ORAL_TABLET | Freq: Every day | ORAL | 1 refills | Status: AC

## 2024-04-03 MED ORDER — TELMISARTAN-HCTZ 80-12.5 MG PO TABS: 1.0000 | ORAL_TABLET | Freq: Every day | ORAL | 1 refills | Status: AC

## 2024-04-06 LAB — CBC WITH DIFFERENTIAL/PLATELET
Absolute Lymphocytes: 1825 {cells}/uL (ref 850–3900)
Absolute Monocytes: 440 {cells}/uL (ref 200–950)
Basophils Absolute: 50 {cells}/uL (ref 0–200)
Basophils Relative: 1 %
Eosinophils Absolute: 160 {cells}/uL (ref 15–500)
Eosinophils Relative: 3.2 %
HCT: 43.1 % (ref 35.0–45.0)
Hemoglobin: 14.2 g/dL (ref 11.7–15.5)
MCH: 31.6 pg (ref 27.0–33.0)
MCHC: 32.9 g/dL (ref 32.0–36.0)
MCV: 96 fL (ref 80.0–100.0)
MPV: 10.4 fL (ref 7.5–12.5)
Monocytes Relative: 8.8 %
Neutro Abs: 2525 {cells}/uL (ref 1500–7800)
Neutrophils Relative %: 50.5 %
Platelets: 240 Thousand/uL (ref 140–400)
RBC: 4.49 Million/uL (ref 3.80–5.10)
RDW: 12.4 % (ref 11.0–15.0)
Total Lymphocyte: 36.5 %
WBC: 5 Thousand/uL (ref 3.8–10.8)

## 2024-04-06 LAB — HEMOGLOBIN A1C
Hgb A1c MFr Bld: 6.4 % — ABNORMAL HIGH (ref <5.7)
Mean Plasma Glucose: 137 mg/dL
eAG (mmol/L): 7.6 mmol/L

## 2024-04-06 LAB — COMPREHENSIVE METABOLIC PANEL WITH GFR
AG Ratio: 1.5 (calc) (ref 1.0–2.5)
ALT: 13 U/L (ref 6–29)
AST: 11 U/L (ref 10–35)
Albumin: 4.1 g/dL (ref 3.6–5.1)
Alkaline phosphatase (APISO): 45 U/L (ref 37–153)
BUN: 14 mg/dL (ref 7–25)
CO2: 28 mmol/L (ref 20–32)
Calcium: 9.3 mg/dL (ref 8.6–10.4)
Chloride: 104 mmol/L (ref 98–110)
Creat: 0.84 mg/dL (ref 0.50–1.05)
Globulin: 2.8 g/dL (ref 1.9–3.7)
Glucose, Bld: 111 mg/dL — ABNORMAL HIGH (ref 65–99)
Potassium: 4.2 mmol/L (ref 3.5–5.3)
Sodium: 139 mmol/L (ref 135–146)
Total Bilirubin: 0.6 mg/dL (ref 0.2–1.2)
Total Protein: 6.9 g/dL (ref 6.1–8.1)
eGFR: 76 mL/min/1.73m2 (ref >=60)

## 2024-04-06 LAB — TSH: TSH: 1.83 m[IU]/L (ref 0.40–4.50)

## 2024-04-06 LAB — QUANTIFERON-TB GOLD PLUS
Mitogen-NIL: >10 [IU]/mL
NIL: 0.02 [IU]/mL
QuantiFERON-TB Gold Plus: NEGATIVE
TB1-NIL: 0.04 [IU]/mL
TB2-NIL: 0.04 [IU]/mL

## 2024-04-06 LAB — LIPID PANEL
Cholesterol: 167 mg/dL (ref <200)
HDL: 50 mg/dL (ref >=50)
LDL Cholesterol (Calc): 96 mg/dL
Non-HDL Cholesterol (Calc): 117 mg/dL (ref <130)
Total CHOL/HDL Ratio: 3.3 (calc) (ref <5.0)
Triglycerides: 109 mg/dL (ref <150)

## 2024-04-09 ENCOUNTER — Ambulatory Visit: Payer: Self-pay | Admitting: Family

## 2024-04-14 NOTE — Progress Notes (Signed)
 Provider: Roxan Plough FNP-C   Lennix Kneisel, Roxan BROCKS, NP  Patient Care Team: Brett Soza, Roxan BROCKS, NP as PCP - General (Family Medicine)  Extended Emergency Contact Information Primary Emergency Contact: [place]ow[place],AVERY Mobile Phone: [phone] Relation: Son Secondary Emergency Contact: Attar,Daniel  [place] Home Phone: [phone] Mobile Phone: [phone] Relation: Son  Code Status:  Full Code  Goals of care: Advanced Directive information    04/03/2024   10:13 AM  Advanced Directives  Does Patient Have a Medical Advance Directive? No  Would patient like information on creating a medical advance directive? No - Patient declined     Chief Complaint  Patient presents with   Medical Management of Chronic Issues    6 Month follow up.    Discussed the use of AI scribe software for clinical note transcription with the patient, who gave verbal consent to proceed.  History of Present Illness   Kathy Juarez is a 68 year old female with obesity and type 2 diabetes who presents for a six-month follow-up visit.  She reports a weight gain since her last visit. She was previously on metformin , which she stopped due to stomach pain. She has not yet started a new medication but mentioned a discussion about GLP-1 receptor agonists. She was previously prescribed Wegovy , but it was not covered by her insurance.  She is currently taking several supplements in gummy form, including vitamin D3, B12, CoQ10, a multivitamin, chlorophyll, and a pre/probiotic. She also takes pravastatin  80 mg and telmisartan /hydrochlorothiazide 80/12.5 mg for cholesterol and blood pressure management, respectively. She has switched from flaxseed oil pills to a liquid form due to difficulty swallowing large pills.  She reports minimal physical activity, stating she has been mostly sedentary but plans to increase her activity level. She has experienced some joint pain, particularly in her  hips, back, and shoulder, but not in her knees. She describes the pain as 'achy' and notes it comes and goes. She reports that she has been doing pretty well with her asthma and occasionally uses her inhaler when her breathing feels off.  She is planning to volunteer and requires a TB screening, which she will complete through blood work. She has a history of positive skin tests but no active TB. She is also due for a dental visit and an eye exam, which she has not yet scheduled.  She has been experiencing significant life stressors, including the recent passing of her mother and family drama. She has been managing household changes, such as getting new furniture and carpet.  Past Medical History:  Diagnosis Date   Abnormal EKG    Allergic rhinitis    Benign essential hypertension    Depressive disorder    High cholesterol    History of colonoscopy    History of mammogram    Hyperlipidemia    Hypertension    Obesity    Vitamin D deficiency    Past Surgical History:  Procedure Laterality Date   ABDOMINAL HYSTERECTOMY     CESAREAN SECTION     x 3   CHOLECYSTECTOMY      Allergies  Allergen Reactions   Fish Allergy    Latex Itching    Allergies as of 04/03/2024       Reactions   Fish Allergy    Latex Itching        Medication List        Accurate as of April 03, 2024 11:59 PM. If you have any questions, ask  your nurse or doctor.          STOP taking these medications    Co Q 10 100 MG Caps Stopped by: Daenerys Buttram C Vivienne Sangiovanni   metFORMIN  500 MG tablet Commonly known as: GLUCOPHAGE  Stopped by: Aliyah Abeyta C Ardell Aaronson   Vitamin B-12 500 MCG Lozg Stopped by: Jakyra Kenealy C Shree Espey   Wegovy  0.25 MG/0.5ML Soaj SQ injection Generic drug: semaglutide -weight management Stopped by: Roxan BROCKS Slayde Brault       TAKE these medications    albuterol  108 (90 Base) MCG/ACT inhaler Commonly known as: VENTOLIN  HFA Inhale 1 puff into the lungs every 6 (six) hours as needed for wheezing or  shortness of breath.   aspirin EC 325 MG tablet Take 325 mg by mouth daily.   FLAX SEED OIL PO Take 5 mLs by mouth daily after breakfast. What changed: Another medication with the same name was removed. Continue taking this medication, and follow the directions you see here. Changed by: Roxan BROCKS Nabria Nevin   OVER THE COUNTER MEDICATION daily.   OVER THE COUNTER MEDICATION   pravastatin  80 MG tablet Commonly known as: PRAVACHOL  Take 1 tablet (80 mg total) by mouth daily.   telmisartan -hydrochlorothiazide 80-12.5 MG tablet Commonly known as: MICARDIS  HCT Take 1 tablet by mouth daily.        Review of Systems  Constitutional:  Negative for appetite change, chills, fatigue, fever and unexpected weight change.  HENT:  Negative for congestion, dental problem, ear discharge, ear pain, facial swelling, hearing loss, nosebleeds, postnasal drip, rhinorrhea, sinus pressure, sinus pain, sneezing, sore throat, tinnitus and trouble swallowing.   Eyes:  Negative for pain, discharge, redness, itching and visual disturbance.  Respiratory:  Negative for cough, chest tightness, shortness of breath and wheezing.   Cardiovascular:  Negative for chest pain, palpitations and leg swelling.  Gastrointestinal:  Negative for abdominal distention, abdominal pain, blood in stool, constipation, diarrhea, nausea and vomiting.  Endocrine: Negative for cold intolerance, heat intolerance, polydipsia, polyphagia and polyuria.  Genitourinary:  Negative for difficulty urinating, dysuria, flank pain, frequency and urgency.  Musculoskeletal:  Negative for arthralgias, back pain, gait problem, joint swelling, myalgias, neck pain and neck stiffness.  Skin:  Negative for color change, pallor, rash and wound.  Neurological:  Negative for dizziness, syncope, speech difficulty, weakness, light-headedness, numbness and headaches.  Hematological:  Does not bruise/bleed easily.  Psychiatric/Behavioral:  Negative for agitation,  behavioral problems, confusion, hallucinations, self-injury, sleep disturbance and suicidal ideas. The patient is not nervous/anxious.     Immunization History  Administered Date(s) Administered   INFLUENZA, HIGH DOSE SEASONAL PF 04/03/2024   Influenza Inj Mdck Quad Pf 03/28/2017   Influenza,inj,Quad PF,6+ Mos 03/13/2015, 04/04/2018   Influenza-Unspecified 04/05/2019, 04/01/2021, 04/09/2022, 04/04/2023   Moderna Sars-Covid-2 Vaccination 08/14/2019, 09/14/2019, 05/21/2020, 04/26/2021   PNEUMOCOCCAL CONJUGATE-20 01/03/2022   Tdap 03/12/2024   Pertinent  Health Maintenance Due  Topic Date Due   DEXA SCAN  Never done   Mammogram  07/24/2025   Colonoscopy  12/29/2029   Influenza Vaccine  Completed      09/18/2022    1:49 PM 10/03/2023    1:52 PM 10/10/2023    3:05 PM 10/11/2023   12:39 PM 04/03/2024   10:12 AM  Fall Risk  Falls in the past year? 0 0 0 0 0  Was there an injury with Fall? 0 0  0 0  Fall Risk Category Calculator 0 0  0 0  Patient at Risk for Falls Due to No Fall Risks No Fall  Risks  No Fall Risks No Fall Risks  Fall risk Follow up  Falls evaluation completed  Falls evaluation completed Falls evaluation completed   Functional Status Survey:    Vitals:   04/03/24 1019  BP: 134/82  Pulse: 88  Resp: 19  Temp: 97.6 F (36.4 C)  SpO2: 95%  Weight: 281 lb 3.2 oz (127.6 kg)  Height: 5' 3 (1.6 m)   Body mass index is 49.81 kg/m. Physical Exam  VITALS: T- 97.6, P- 88, BP- 134/82, SaO2- 95% MEASUREMENTS: Weight- 281.2. GENERAL: Alert, cooperative, well developed, no acute distress HEENT: Normocephalic, normal oropharynx, moist mucous membranes, ears normal, nose normal, eyes normal, no sinus tenderness NECK: Thyroid  normal CHEST: Clear to auscultation bilaterally, no wheezes, rhonchi, or crackles, no chest wall tenderness CARDIOVASCULAR: Normal heart rate and rhythm, S1 and S2 normal without murmurs ABDOMEN: Soft, non-tender, non-distended, without organomegaly,  normal bowel sounds EXTREMITIES: No cyanosis or edema, no knee swelling, no calf tenderness, no extremity pain NEUROLOGICAL: Cranial nerves grossly intact, moves all extremities without gross motor or sensory deficit  SKIN: No rash,no lesion or erythema   PSYCHIATRY/BEHAVIORAL: Mood stable   Labs reviewed: Recent Labs    10/04/23 0850 04/03/24 1123  NA 141 139  K 3.8 4.2  CL 105 104  CO2 26 28  GLUCOSE 108* 111*  BUN 17 14  CREATININE 0.73 0.84  CALCIUM 9.1 9.3   Recent Labs    10/04/23 0850 04/03/24 1123  AST 14 11  ALT 16 13  BILITOT 0.4 0.6  PROT 6.5 6.9   Recent Labs    10/04/23 0850 04/03/24 1123  WBC 4.5 5.0  NEUTROABS 1,589 2,525  HGB 13.5 14.2  HCT 40.3 43.1  MCV 94.8 96.0  PLT 230 240   Lab Results  Component Value Date   TSH 1.83 04/03/2024   Lab Results  Component Value Date   HGBA1C 6.4 (H) 04/03/2024   Lab Results  Component Value Date   CHOL 167 04/03/2024   HDL 50 04/03/2024   LDLCALC 96 04/03/2024   TRIG 109 04/03/2024   CHOLHDL 3.3 04/03/2024    Significant Diagnostic Results in last 30 days:  No results found.  Assessment/Plan  Obesity BMI 49.81 with associated comorbidity Hypertension ,Hyperlipidemia and Asthma  Weight increased from 268 lbs to 281.2 lbs. Previous prescription for Wegovy  not covered by insurance. Exploring options for GLP-1 receptor agonists such as Ozempic or Mounjaro. Discussed exercise and dietary modifications. - Recheck A1c and lipid panel - Encourage gradual increase in physical activity, starting with walking and considering water aerobics - Discuss dietary modifications focusing on reducing carbohydrate intake and increasing lean protein and vegetables  Prediabetes Previous A1c was 6.4%. No longer taking metformin  due to stomach issues. Awaiting new medication from weight loss doctor. Discussed dietary changes and exercise to manage blood sugar levels. - Recheck A1c - Discuss new medication with weight  loss doctor - Encourage dietary modifications to reduce carbohydrate intake - Encourage gradual increase in physical activity  Essential hypertension Blood pressure well-controlled at 134/82 mmHg. - Continue current antihypertensive medications: telmisartan /hydrochlorothiazide 80/12.5 mg - Refill prescriptions at [facility]  Mixed hyperlipidemia Cholesterol levels improved with LDL at 100 mg/dL, down from 877 mg/dL. Triglycerides at 75 mg/dL and total cholesterol at 174 mg/dL. Continues on pravastatin  80 mg. - Continue pravastatin  80 mg - Refill prescription at [facility] - Recheck lipid panel with upcoming blood work  Asthma Asthma well-controlled with infrequent use of rescue inhaler. No recent flare-ups or wheezing.  Hearing loss Reports slight difficulty in hearing, possibly due to earwax buildup. Earwax removed during visit.   Family/ staff Communication: Reviewed plan of care with patient verbalized understanding   Labs/tests ordered:  - CBC with Differential/Platelet - CMP with eGFR([facility]) - TSH - Hgb A1C - Lipid panel - QuantiFeron TB gold plus   Next Appointment : Return in about 6 months (around 10/02/2024) for medical mangement of chronic issues.SABRA   Spent 30 minutes of Face to face and non-face to face with patient  >50% time spent counseling; reviewing medical record; tests; labs; documentation and developing future plan of care.   Roxan JAYSON Plough, NP

## 2024-06-10 ENCOUNTER — Other Ambulatory Visit

## 2024-10-02 ENCOUNTER — Ambulatory Visit: Payer: Self-pay | Admitting: Family
